# Patient Record
Sex: Male | Born: 1955 | Race: White | Hispanic: No | Marital: Married | Smoking: Never smoker
Health system: Southern US, Community
[De-identification: ages and names within clinical notes are randomized; demographics above are authoritative.]

## PROBLEM LIST (undated history)

## (undated) DIAGNOSIS — T7840XA Allergy, unspecified, initial encounter: Secondary | ICD-10-CM

## (undated) HISTORY — DX: Allergy, unspecified, initial encounter: T78.40XA

## (undated) HISTORY — PX: TONSILLECTOMY AND ADENOIDECTOMY: SHX28

## (undated) HISTORY — PX: MANDIBLE FRACTURE SURGERY: SHX706

## (undated) HISTORY — PX: THROAT SURGERY: SHX803

## (undated) HISTORY — PX: CLAVICLE SURGERY: SHX598

---

## 2008-08-17 ENCOUNTER — Encounter: Admission: RE | Admit: 2008-08-17 | Discharge: 2008-08-17 | Payer: Self-pay | Admitting: Family Medicine

## 2010-09-19 IMAGING — CT CT CHEST W/ CM
2 of 4 series · 15 of 36 positions shown, 18 images · IV contrast (CONTRAST)
Comparison: None

CLINICAL DATA: Bone pain, evaluate pulmonary nodules seen on chest
radiograph

CT CHEST WITH CONTRAST
TECHNIQUE: Multidetector CT imaging of the chest was performed
following the standard protocol during bolus administration of
intravenous contrast.
Contrast: 75 ml of omni 300

[Series 2: w/ · axial · 0.68mm/px · z∈[+1393,+1688]mm · 12 of 69 slices shown, 15 images]
[im 5/69  mediastinal]
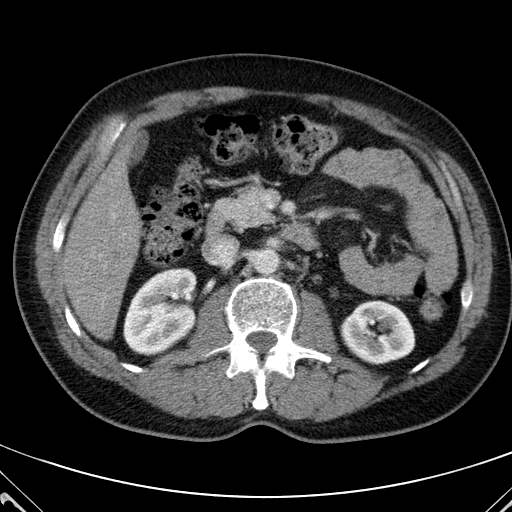
[im 5/69  lung]
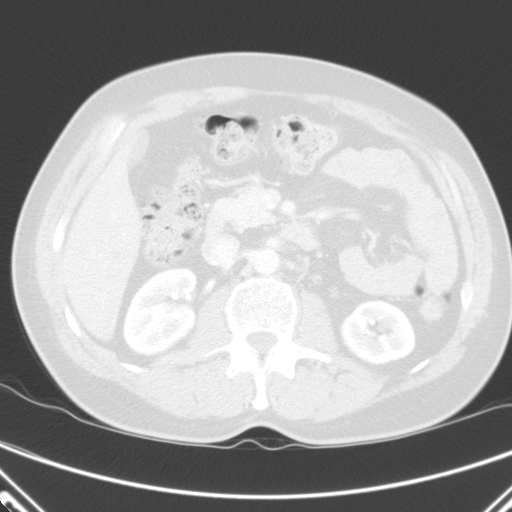
[im 10/69  lung]
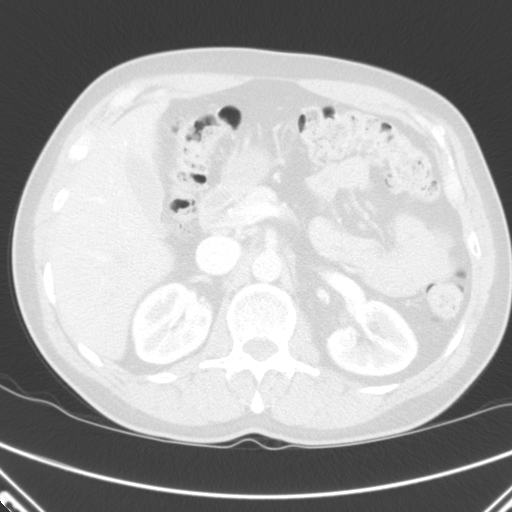
[im 15/69  lung]
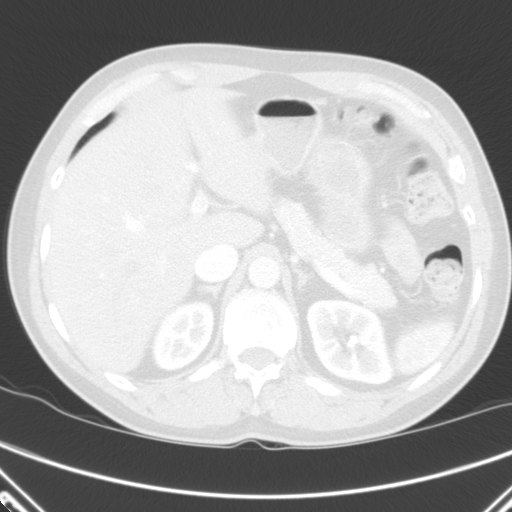
[im 20/69  lung]
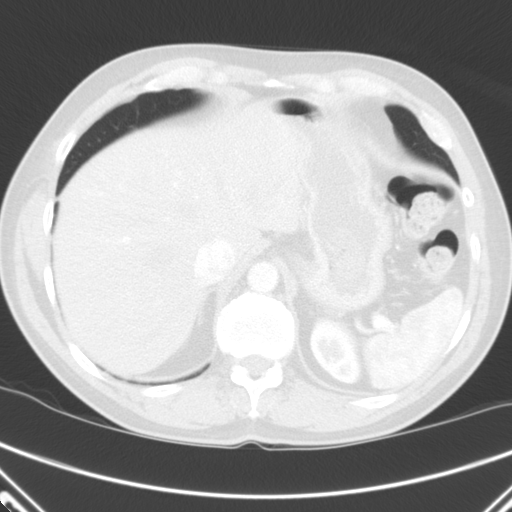
[im 25/69  mediastinal]
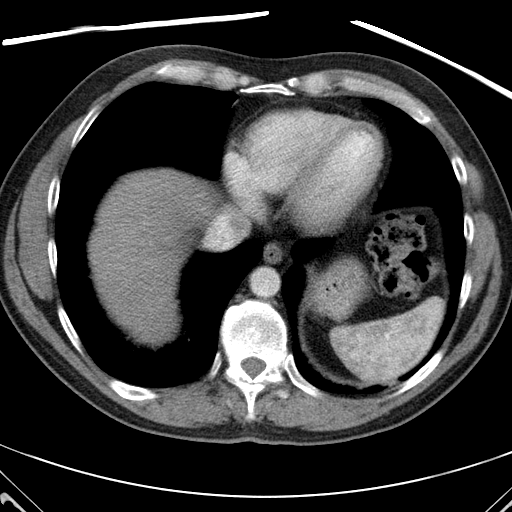
[im 25/69  lung]
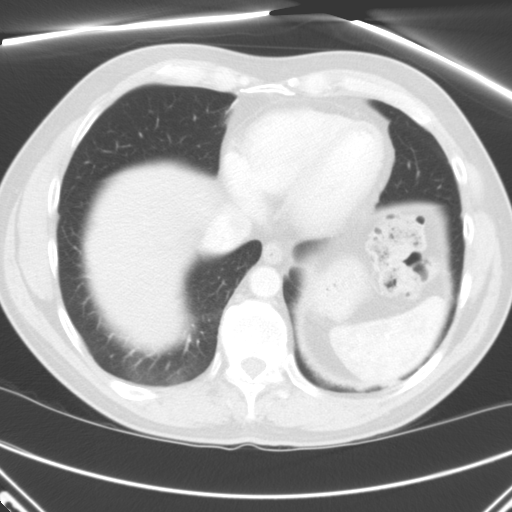
[im 30/69  lung]
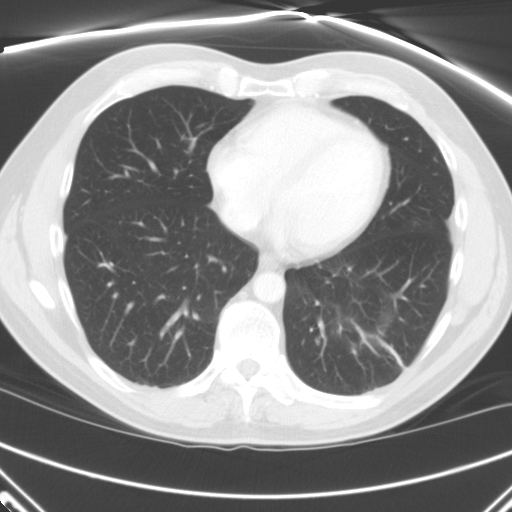
[im 39/69  lung]
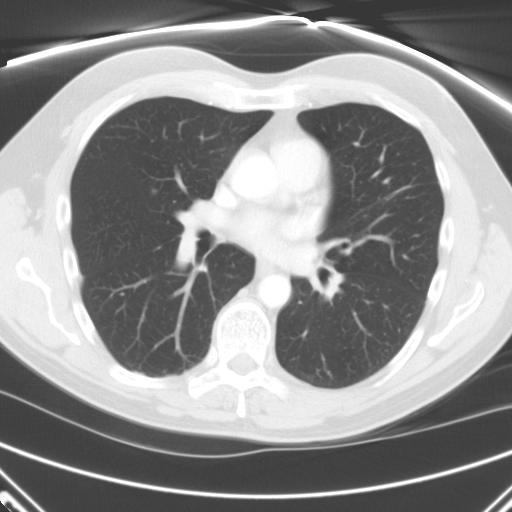
[im 44/69  lung]
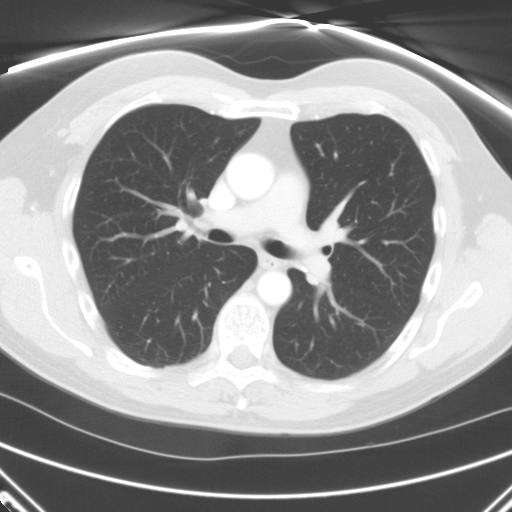
[im 49/69  mediastinal]
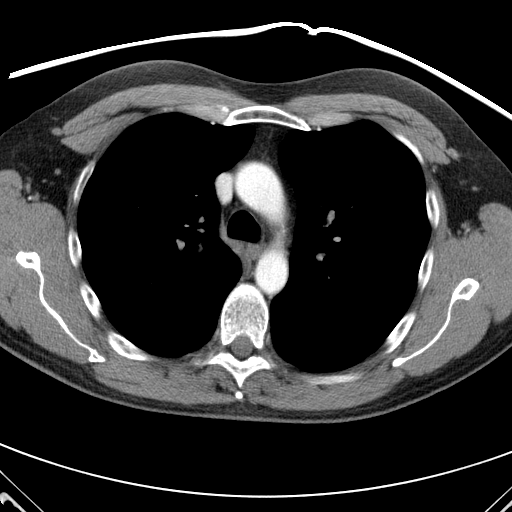
[im 49/69  lung]
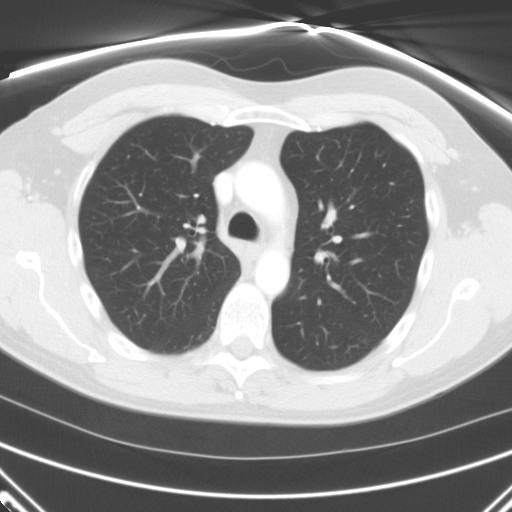
[im 54/69  lung]
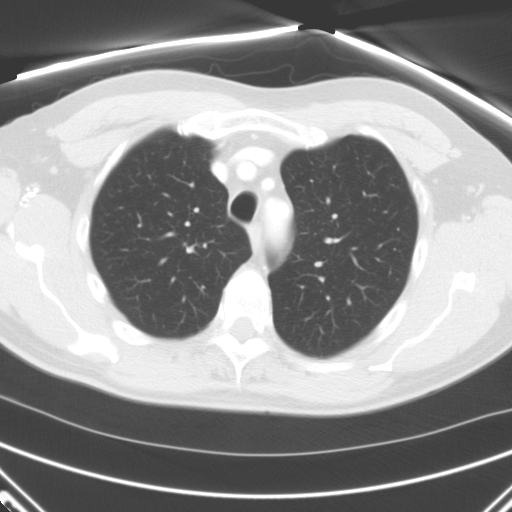
[im 59/69  lung]
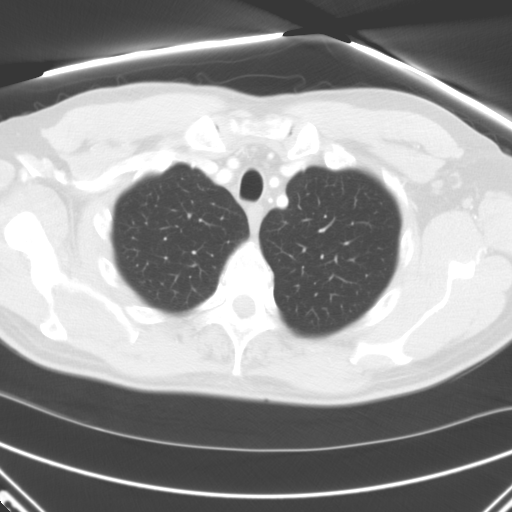
[im 64/69  lung]
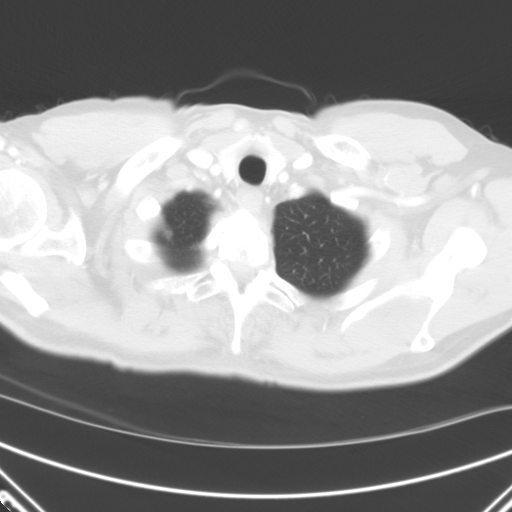

[coronals · coronal · 0.68mm/px · 3 of 103 slices shown]
[im 21/103  lung]
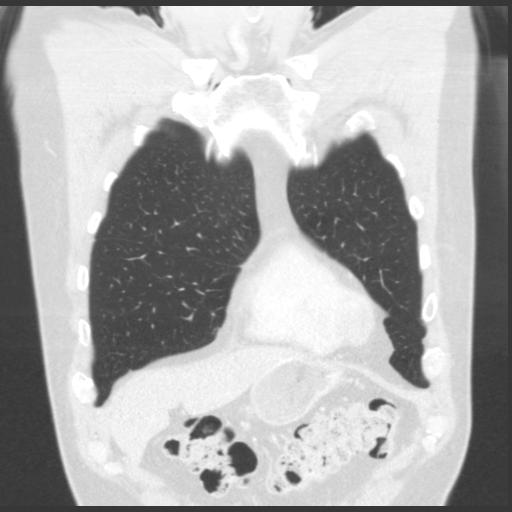
[im 41/103  lung]
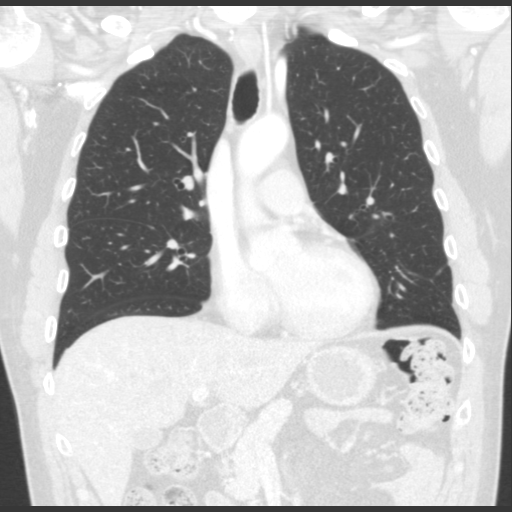
[im 62/103  lung]
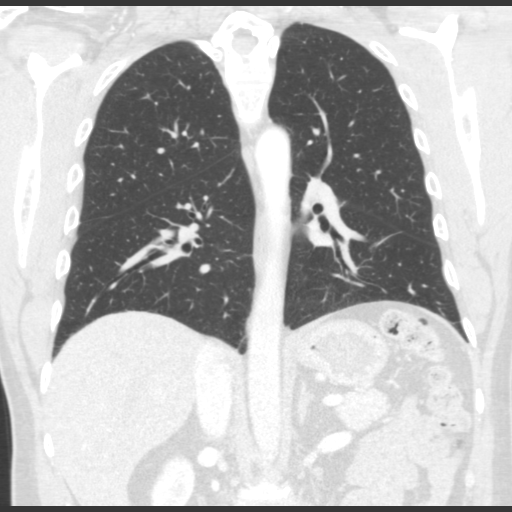

[15 of 36 positions shown; findings below may reference images not displayed]

FINDINGS: There is no enlarged axillary or supraclavicular lymph nodes.

There is no mediastinal or hilar adenopathy.

There is no pericardial or pleural effusion.

Within the left lower lobe there is a calcified granuloma which
measures 8 mm, image 33.

There is a noncalcified subpleural nodule within the right middle
lobe which measures 4.4 mm, image 32.

No additional pulmonary nodules or masses identified.

Review of the visualized osseous structures shows no suspicious
lytic or sclerotic bone lesions.

Limited imaging through the upper abdomen shows no lytic or
sclerotic lesions.
IMPRESSION: 1.  Pulmonary nodule in the left base is densely calcified
consistent with granuloma.
2.  Noncalcified subpleural nodule the right middle lobe may
represent a subpleural lymph node. If the patient is a smoker or
has a risk factors for bronchogenic carcinoma, follow-up chest CT
at 1 year is recommended.  This recommendation follows the
consensus statement:  "Guidelines for Management of Small Pulmonary
Nodules Detected on CT Scans:  A Statement from the [facility]

## 2011-05-06 ENCOUNTER — Ambulatory Visit: Payer: Managed Care, Other (non HMO)

## 2011-05-06 DIAGNOSIS — L02419 Cutaneous abscess of limb, unspecified: Secondary | ICD-10-CM

## 2011-05-06 DIAGNOSIS — L258 Unspecified contact dermatitis due to other agents: Secondary | ICD-10-CM

## 2011-07-02 ENCOUNTER — Ambulatory Visit: Payer: Managed Care, Other (non HMO) | Admitting: Family Medicine

## 2011-07-02 VITALS — BP 110/78 | HR 68 | Temp 97.8°F | Resp 16 | Ht 71.0 in | Wt 193.0 lb

## 2011-07-02 DIAGNOSIS — J069 Acute upper respiratory infection, unspecified: Secondary | ICD-10-CM

## 2011-07-02 DIAGNOSIS — J019 Acute sinusitis, unspecified: Secondary | ICD-10-CM

## 2011-07-02 MED ORDER — AMOXICILLIN 500 MG PO CAPS: ORAL_CAPSULE | ORAL | Status: DC

## 2011-07-02 NOTE — Progress Notes (Signed)
  Patient Name: Colton Suarez Date of Birth: 1956/05/16 Medical Record Number: [id number] Gender: male Date of Encounter: 07/02/2011  History of Present Illness:  Colton Suarez is a 56 y.o. very pleasant male patient who presents with the following:  Ilness for about 10 days, sinus drainage and scratchy throat.  No cough- symptoms are more in his head. Sinus pressure over right eye. Some ear pressure for 2 days.  No fever, chills or myalgias.  "I'm supposed to take a zyrtec" but sometimes seems to make him drowsy so he does not always take this.  Otherwise generally healthy and well.  I have reviewed the patient's medical history in detail and updated the computerized patient record.  There is no problem list on file for this patient.  Past Medical History  Diagnosis Date  . Allergy   . Hyperlipidemia    Past Surgical History  Procedure Date  . Tonsillectomy and adenoidectomy     at 56yo   History  Substance Use Topics  . Smoking status: Never Smoker   . Smokeless tobacco: Never Used  . Alcohol Use: Yes     social   Family History  Problem Relation Age of Onset  . Hypertension Father    Allergies  Allergen Reactions  . Other     STEROIDS    Medication list has been reviewed and updated.  Review of Systems: As per HPI.  Otherwise he reports no GI symptoms, does have some sneezing and runny nose.  Nasal discharge is becoming thick and discolored.    Physical Examination: Filed Vitals:   07/02/11 0817  BP: 110/78  Pulse: 68  Temp: 97.8 F (36.6 C)  Resp: 16  Height: 5\' 11"  (1.803 m)  Weight: 193 lb (87.544 kg)    Body mass index is 26.92 kg/(m^2).  GEN: WDWN, NAD, Non-toxic, A & O x 3 HEENT: Atraumatic, Normocephalic. Neck supple. No masses, No LAD.  oropharynx wnl but visible drainage down throat.  Ears and Nose: No external deformity.  TM and IAC wnl bilaterally, nasal cavity is inflamed and swollen.   CV: RRR, No M/G/R. No JVD. No thrill. No extra heart  sounds. PULM: CTA B, no wheezes, crackles, rhonchi. No retractions. No resp. distress. No accessory muscle use. ABD: S, NT, ND, +BS. No rebound. No HSM. EXTR: No c/c/e NEURO Normal gait.  PSYCH: Normally interactive. Conversant. Not depressed or anxious appearing.  Calm demeanor.    Assessment and Plan: 1. Sinusitis acute  amoxicillin (AMOXIL) 500 MG capsule   As above. Patient (or parent if minor) instructed to return to clinic or call if not better in 3-4 day(s).  Encouraged plenty of fluids, mucinex as needed.

## 2011-07-02 NOTE — Progress Notes (Deleted)
  Subjective:    Patient ID: Colton Suarez, male    DOB: 04-27-1956, 56 y.o.   MRN: [id number]  HPI    Review of Systems     Objective:   Physical Exam        Assessment & Plan:

## 2011-07-08 ENCOUNTER — Ambulatory Visit: Payer: Managed Care, Other (non HMO) | Admitting: Family Medicine

## 2011-07-08 VITALS — BP 110/73 | HR 67 | Temp 97.8°F | Resp 16 | Ht 69.5 in | Wt 190.2 lb

## 2011-07-08 DIAGNOSIS — J019 Acute sinusitis, unspecified: Secondary | ICD-10-CM

## 2011-07-08 MED ORDER — AZITHROMYCIN 250 MG PO TABS: ORAL_TABLET | ORAL | Status: AC

## 2011-07-08 MED ORDER — PREDNISONE 20 MG PO TABS: ORAL_TABLET | ORAL | Status: AC

## 2011-07-08 NOTE — Progress Notes (Signed)
  Subjective:    Patient ID: Colton Suarez, male    DOB: 01-07-1956, 56 y.o.   MRN: [id number]  HPI 56 yo male seen 07/02/11 and diagnosed with sinusitis.  Started on Amoxicillin.  Since that time, worsened.  Increased sinus pressure, ear pressure, thickened nasal discharge, brown.  Cough also productive brown phlegm.  Occasional subjective fever.  Sinus pressure/HA worse outside.  Teeth hurt too.  Cough not bad at night.  "Amoxicillin never works.  Need zpak"  Review of Systems Negative except as per HPI     Objective:   Physical Exam  Constitutional: He appears well-developed. No distress.  HENT:  Right Ear: External ear and ear canal normal. Tympanic membrane is not injected, not scarred, not perforated, not erythematous, not retracted and not bulging. A middle ear effusion is present.  Left Ear: Tympanic membrane, external ear and ear canal normal. Tympanic membrane is not injected, not scarred, not perforated, not erythematous, not retracted and not bulging.  Nose: Mucosal edema present. No rhinorrhea. Right sinus exhibits maxillary sinus tenderness and frontal sinus tenderness. Left sinus exhibits no maxillary sinus tenderness and no frontal sinus tenderness.  Mouth/Throat: Uvula is midline, oropharynx is clear and moist and mucous membranes are normal. No oropharyngeal exudate or tonsillar abscesses.  Cardiovascular: Normal rate, regular rhythm, normal heart sounds and intact distal pulses.   No murmur heard. Pulmonary/Chest: Effort normal and breath sounds normal. No respiratory distress. He has no wheezes. He has no rales.  Lymphadenopathy:       Head (right side): No submandibular and no preauricular adenopathy present.       Head (left side): No submandibular and no preauricular adenopathy present.       Right cervical: No superficial cervical and no posterior cervical adenopathy present.      Left cervical: No superficial cervical and no posterior cervical adenopathy present.      Right: No supraclavicular adenopathy present.       Left: No supraclavicular adenopathy present.  Skin: Skin is warm and dry.          Assessment & Plan:  Sinusitis, resistant zpak pred taper

## 2011-11-30 ENCOUNTER — Ambulatory Visit: Payer: Managed Care, Other (non HMO) | Admitting: Physician Assistant

## 2011-11-30 VITALS — BP 124/76 | HR 84 | Temp 99.0°F | Resp 17 | Ht 70.0 in | Wt 187.0 lb

## 2011-11-30 DIAGNOSIS — R059 Cough, unspecified: Secondary | ICD-10-CM

## 2011-11-30 DIAGNOSIS — R05 Cough: Secondary | ICD-10-CM

## 2011-11-30 DIAGNOSIS — J329 Chronic sinusitis, unspecified: Secondary | ICD-10-CM

## 2011-11-30 MED ORDER — IPRATROPIUM BROMIDE 0.03 % NA SOLN: 2.0000 | Freq: Two times a day (BID) | NASAL | Status: DC

## 2011-11-30 MED ORDER — AZITHROMYCIN 250 MG PO TABS: ORAL_TABLET | ORAL | Status: AC

## 2011-11-30 MED ORDER — HYDROCODONE-HOMATROPINE 5-1.5 MG/5ML PO SYRP: 5.0000 mL | ORAL_SOLUTION | Freq: Three times a day (TID) | ORAL | Status: AC | PRN

## 2011-11-30 NOTE — Progress Notes (Signed)
  Subjective:    Patient ID: Colton Suarez, male    DOB: [birth year], 56 y.o.   MRN: [id number]  Sinus Problem  Ear Fullness    Patient presents with 3 day history of nasal congestion, postnasal drainage, sinus pain/pressure, and ear fullness. Also complains of sore throat that has improved, as well as some fever/chills. Denies headache, nausea, vomiting, or otalgia.  No know sick contacts. No history of asthma or seasonal allergies.    Review of Systems  All other systems reviewed and are negative.       Objective:   Physical Exam  Constitutional: He is oriented to person, place, and time. He appears well-developed and well-nourished.  HENT:  Head: Normocephalic and atraumatic.  Right Ear: Hearing, external ear and ear canal normal. A middle ear effusion is present.  Left Ear: Hearing, tympanic membrane, external ear and ear canal normal.  Nose: Right sinus exhibits maxillary sinus tenderness. Right sinus exhibits no frontal sinus tenderness. Left sinus exhibits maxillary sinus tenderness. Left sinus exhibits no frontal sinus tenderness.  Mouth/Throat: Uvula is midline, oropharynx is clear and moist and mucous membranes are normal. No oropharyngeal exudate.  Eyes: Conjunctivae are normal.  Neck: Normal range of motion. Neck supple.  Cardiovascular: Normal rate, regular rhythm and normal heart sounds.   Pulmonary/Chest: Effort normal and breath sounds normal.  Lymphadenopathy:    He has no cervical adenopathy.  Neurological: He is alert and oriented to person, place, and time.  Psychiatric: He has a normal mood and affect. His behavior is normal. Judgment and thought content normal.          Assessment & Plan:   1. Sinusitis  Will treat with a Z-pack. Use Atrovent NS as directed If symptoms not improving in 3-4 days, ok to call in prednisone taper (per patient ok to take this).  azithromycin (ZITHROMAX) 250 MG tablet, ipratropium (ATROVENT) 0.03 % nasal spray  2. Cough    Take Hycodan as needed for cough.  HYDROcodone-homatropine (HYCODAN) 5-1.5 MG/5ML syrup

## 2012-01-15 ENCOUNTER — Ambulatory Visit (INDEPENDENT_AMBULATORY_CARE_PROVIDER_SITE_OTHER): Payer: Managed Care, Other (non HMO) | Admitting: Internal Medicine

## 2012-01-15 VITALS — BP 128/78 | HR 61 | Temp 97.8°F | Resp 16 | Ht 69.0 in | Wt 191.0 lb

## 2012-01-15 DIAGNOSIS — L02419 Cutaneous abscess of limb, unspecified: Secondary | ICD-10-CM

## 2012-01-15 DIAGNOSIS — L03116 Cellulitis of left lower limb: Secondary | ICD-10-CM

## 2012-01-15 DIAGNOSIS — B369 Superficial mycosis, unspecified: Secondary | ICD-10-CM

## 2012-01-15 MED ORDER — DOXYCYCLINE HYCLATE 100 MG PO TABS: 100.0000 mg | ORAL_TABLET | Freq: Two times a day (BID) | ORAL | Status: AC

## 2012-01-15 MED ORDER — TRIAMCINOLONE ACETONIDE 0.1 % EX CREA: TOPICAL_CREAM | Freq: Two times a day (BID) | CUTANEOUS | Status: DC

## 2012-01-15 NOTE — Progress Notes (Signed)
  Subjective:    Patient ID: Colton Suarez, male    DOB: 07-06-1955, 56 y.o.   MRN: [id number]  HPI    Review of Systems     Objective:   Physical Exam        Assessment & Plan:

## 2012-01-15 NOTE — Patient Instructions (Addendum)

## 2012-01-15 NOTE — Progress Notes (Signed)
  Subjective:    Patient ID: Colton Suarez, male    DOB: 07-29-1955, 56 y.o.   MRN: [id number]  HPI  Patient presents with one month history of wound to lower leg. He had injury tree branch hit the leg and has had redness since. Feels like the area is infected.  Tender has no healed for one month/ Patient also with complaints of nasal/ sinus drainage nad congetsin Review of Systems  HENT: Positive for congestion and rhinorrhea.   Skin: Positive for rash and wound.  All other systems reviewed and are negative.       Objective:   Physical Exam  Nursing note and vitals reviewed. Constitutional: He is oriented to person, place, and time. He appears well-developed and well-nourished.  Eyes: Conjunctivae are normal. Pupils are equal, round, and reactive to light.  Neck: Normal range of motion. Neck supple.  Cardiovascular: Normal rate and normal heart sounds.   Pulmonary/Chest: Effort normal.  Abdominal: Soft. Bowel sounds are normal.  Musculoskeletal: He exhibits tenderness.       Erythema of lower extemity wound area with lacy annular lesions surrounding the area.   Neurological: He is alert and oriented to person, place, and time.  Skin:       As noted on exam  Psychiatric: He has a normal mood and affect. His behavior is normal. Judgment and thought content normal.    Patient with redness and has ? Fungal infection as well. No purulence could be expressed from this area.       Assessment & Plan:  Cellulitis with superficail seconday fungal infectionShould elevate leg and apply kenalog cream for fungal infection and take doxycycline until gone.Marland Kitchenkeep open to the air.

## 2012-03-15 ENCOUNTER — Ambulatory Visit: Payer: Managed Care, Other (non HMO)

## 2012-03-15 ENCOUNTER — Ambulatory Visit (INDEPENDENT_AMBULATORY_CARE_PROVIDER_SITE_OTHER): Payer: Managed Care, Other (non HMO) | Admitting: Emergency Medicine

## 2012-03-15 VITALS — BP 104/60 | HR 60 | Temp 98.1°F | Resp 16 | Ht 71.0 in | Wt 186.4 lb

## 2012-03-15 DIAGNOSIS — N529 Male erectile dysfunction, unspecified: Secondary | ICD-10-CM

## 2012-03-15 DIAGNOSIS — Z0000 Encounter for general adult medical examination without abnormal findings: Secondary | ICD-10-CM

## 2012-03-15 DIAGNOSIS — Z23 Encounter for immunization: Secondary | ICD-10-CM

## 2012-03-15 DIAGNOSIS — Z0289 Encounter for other administrative examinations: Secondary | ICD-10-CM

## 2012-03-15 LAB — COMPREHENSIVE METABOLIC PANEL
ALT: 17 U/L (ref 0–53)
AST: 18 U/L (ref 0–37)
Alkaline Phosphatase: 62 U/L (ref 39–117)
Sodium: 139 mEq/L (ref 135–145)
Total Bilirubin: 0.8 mg/dL (ref 0.3–1.2)
Total Protein: 7 g/dL (ref 6.0–8.3)

## 2012-03-15 LAB — POCT CBC
Lymph, poc: 1.6 (ref 0.6–3.4)
MCHC: 31.4 g/dL — AB (ref 31.8–35.4)
MPV: 8.4 fL (ref 0–99.8)
POC Granulocyte: 2.4 (ref 2–6.9)
POC LYMPH PERCENT: 37 %L (ref 10–50)
POC MID %: 8.1 %M (ref 0–12)
Platelet Count, POC: 254 10*3/uL (ref 142–424)
RDW, POC: 13.8 %

## 2012-03-15 LAB — LIPID PANEL
LDL Cholesterol: 118 mg/dL — ABNORMAL HIGH (ref 0–99)
Triglycerides: 47 mg/dL (ref <150)
VLDL: 9 mg/dL (ref 0–40)

## 2012-03-15 LAB — TESTOSTERONE: Testosterone: 518.94 ng/dL (ref 300–890)

## 2012-03-15 NOTE — Progress Notes (Signed)
[facility] [address] [place] [address] [phone]  Date:  03/15/2012   Name:  Colton Suarez   DOB:  1955-07-25   MRN:  [id number]  PCP:  No primary provider on file.    Chief Complaint: Annual Exam and Rash   History of Present Illness:  Colton Suarez is a 56 y.o. very pleasant male patient who presents with the following:  Seen twice he says for a "fungal infection" on hie left shin after brushing it against some weeds.  Has recurred since visit in August.  Symptomatic for two weeks.  There is no problem list on file for this patient.   Past Medical History  Diagnosis Date  . Allergy   . Hyperlipidemia     Past Surgical History  Procedure Date  . Tonsillectomy and adenoidectomy     at 56yo  . Throat surgery     tosilectomy    History  Substance Use Topics  . Smoking status: Never Smoker   . Smokeless tobacco: Never Used  . Alcohol Use: Yes     social    Family History  Problem Relation Age of Onset  . Hypertension Father     Allergies  Allergen Reactions  . Other     STEROIDS    Medication list has been reviewed and updated.  Current Outpatient Prescriptions on File Prior to Visit  Medication Sig Dispense Refill  . cetirizine (ZYRTEC) 10 MG tablet Take 10 mg by mouth daily.      Marland Kitchen ipratropium (ATROVENT) 0.03 % nasal spray Place 2 sprays into the nose 2 (two) times daily.  30 mL  5  . triamcinolone cream (KENALOG) 0.1 % Apply topically 2 (two) times daily.  15 g  1    Review of Systems:  As per HPI, otherwise negative.    Physical Examination: Filed Vitals:   03/15/12 1031  BP: 104/60  Pulse: 60  Temp: 98.1 F (36.7 C)  Resp: 16   Filed Vitals:   03/15/12 1031  Height: 5\' 11"  (1.803 m)  Weight: 186 lb 6.4 oz (84.55 kg)   Body mass index is 26.00 kg/(m^2). Ideal Body Weight: Weight in (lb) to have BMI = 25: 178.9   GEN: WDWN, NAD, Non-toxic, A & O x 3 HEENT: Atraumatic, Normocephalic. Neck supple.  No masses, No LAD. Ears and Nose: No external deformity. CV: RRR, No M/G/R. No JVD. No thrill. No extra heart sounds. PULM: CTA B, no wheezes, crackles, rhonchi. No retractions. No resp. distress. No accessory muscle use. ABD: S, NT, ND, +BS. No rebound. No HSM. EXTR: No c/c/e NEURO Normal gait.  PSYCH: Normally interactive. Conversant. Not depressed or anxious appearing.  Calm demeanor.  SKIN:  Left shin lesions; inferior lesion discolored, does not blanch, appears ecchymotic.  Scaly.  Superior lesion is area about 1.5 x  1.5 cm of superficial skin avulsion.  No cellulitis.  Assessment and Plan: wellness exam labs  Carmelina Dane, MD

## 2012-03-20 ENCOUNTER — Telehealth: Payer: Self-pay | Admitting: Radiology

## 2012-03-20 ENCOUNTER — Encounter: Payer: Self-pay | Admitting: *Deleted

## 2012-03-20 NOTE — Telephone Encounter (Signed)
Patient has called and was advised of his lab results. He is asking about his dermatology appt. He is advise it looks like it has been sent. Will you call him to advise where it was sent? Amy

## 2012-03-24 NOTE — Progress Notes (Signed)
Reviewed and agree.

## 2012-08-14 ENCOUNTER — Ambulatory Visit: Payer: Self-pay | Admitting: Internal Medicine

## 2012-08-14 VITALS — BP 92/60 | HR 63 | Temp 98.1°F | Resp 16 | Ht 69.5 in | Wt 189.0 lb

## 2012-08-14 DIAGNOSIS — Z0289 Encounter for other administrative examinations: Secondary | ICD-10-CM

## 2012-08-14 NOTE — Progress Notes (Signed)
  Subjective:    Patient ID: Colton Suarez, male    DOB: 06/15/1955, 57 y.o.   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

## 2012-10-04 ENCOUNTER — Encounter: Payer: Self-pay | Admitting: Internal Medicine

## 2012-10-19 ENCOUNTER — Ambulatory Visit: Payer: Managed Care, Other (non HMO) | Admitting: Family Medicine

## 2012-10-19 VITALS — BP 100/68 | HR 78 | Temp 98.0°F | Resp 16 | Ht 71.0 in | Wt 192.6 lb

## 2012-10-19 DIAGNOSIS — B353 Tinea pedis: Secondary | ICD-10-CM

## 2012-10-19 DIAGNOSIS — S90821A Blister (nonthermal), right foot, initial encounter: Secondary | ICD-10-CM

## 2012-10-19 DIAGNOSIS — IMO0002 Reserved for concepts with insufficient information to code with codable children: Secondary | ICD-10-CM

## 2012-10-19 LAB — POCT SKIN KOH: Skin KOH, POC: POSITIVE

## 2012-10-19 NOTE — Progress Notes (Signed)
[facility]:  Office Visit  Chief Complaint:  Chief Complaint  Patient presents with  . Foot Problem    x 1 week  right foot     HPI: Colton Suarez is a 57 y.o. male who complains of  1 week history of foot rash. He denies any fevers, chills. He has not been to the beach,has not been soaking his feet, has not been doing anything different. He noticed it first on the middle of his right foot with vesicles and now he has bumps in between his web spaces, they are filled with fluid. No pain, no itching thus far. He denies having it before. He has not tried anything for it. He does wear socks and shoes most of the day. Denies any autoimmune disease.   Past Medical History  Diagnosis Date  . Allergy   . Hyperlipidemia    Past Surgical History  Procedure Laterality Date  . Tonsillectomy and adenoidectomy      at 57yo  . Throat surgery      tosilectomy   History   Social History  . Marital Status: Single    Spouse Name: N/A    Number of Children: N/A  . Years of Education: N/A   Social History Main Topics  . Smoking status: Never Smoker   . Smokeless tobacco: Never Used  . Alcohol Use: Yes     Comment: social  . Drug Use: No  . Sexually Active: None   Other Topics Concern  . None   Social History Narrative  . None   Family History  Problem Relation Age of Onset  . Hypertension Father    Allergies  Allergen Reactions  . Other     STEROIDS   Prior to Admission medications   Medication Sig Start Date End Date Taking? Authorizing Provider  cetirizine (ZYRTEC) 10 MG tablet Take 10 mg by mouth daily.   Yes Historical Provider, MD     ROS: The patient denies fevers, chills, night sweats, unintentional weight loss, chest pain, palpitations, wheezing, dyspnea on exertion, nausea, vomiting, abdominal pain, dysuria, hematuria, melena, numbness, weakness, or tingling.   All other systems have been reviewed and were otherwise negative with the exception  of those mentioned in the HPI and as above.    PHYSICAL EXAM: Filed Vitals:   10/19/12 1454  BP: 100/68  Pulse: 78  Temp: 98 F (36.7 C)  Resp: 16   Filed Vitals:   10/19/12 1454  Height: 5\' 11"  (1.803 m)  Weight: 192 lb 9.6 oz (87.363 kg)   Body mass index is 26.87 kg/(m^2).  General: Alert, no acute distress HEENT:  Normocephalic, atraumatic, oropharynx patent.  Cardiovascular:  Regular rate and rhythm, no rubs murmurs or gallops.  No pedal edema.  Respiratory: Clear to auscultation bilaterally.  No wheezes, rales, or rhonchi.  No cyanosis, no use of accessory musculature GI: No organomegaly, abdomen is soft and non-tender, positive bowel sounds.  No masses. Skin: see below Neurologic: Facial musculature symmetric. Psychiatric: Patient is appropriate throughout our interaction. Lymphatic: No cervical lymphadenopathy Musculoskeletal: Gait intact. + small areas of right foot affected, maceration and cracks/fissures  in between web spaces of toes,  vesicles on middle toe, serosanguinous blisters. No pus. Light ot dark borwn spots appears to be vesicles that have popped and are now healing    LABS: Results for orders placed in visit on 10/19/12  POCT SKIN KOH      Result Value Range  Skin KOH, POC Positive       EKG/XRAY:   Primary read interpreted by Dr. Conley Rolls at [facility].   ASSESSMENT/PLAN: Encounter Diagnoses  Name Primary?  . Blister of foot, right, initial encounter Yes  . Tinea pedis of right foot    ?  acute vesicular tinea pedis KOH + budding yeast Recommend Burrow's wet dressing BID or daily Recommend otc Lamisil cream or spray BID x 2-4 weeks If no improvement then will try Lamisil 250 mg daily x 3 weeks.  Still if no improvement then will refer to derm F/u prn   Thedore Pickel PHUONG, DO 10/19/2012 3:39 PM

## 2013-02-10 ENCOUNTER — Encounter: Payer: Self-pay | Admitting: Family Medicine

## 2013-02-10 ENCOUNTER — Ambulatory Visit (INDEPENDENT_AMBULATORY_CARE_PROVIDER_SITE_OTHER): Payer: Managed Care, Other (non HMO) | Admitting: Family Medicine

## 2013-02-10 VITALS — BP 100/62 | HR 87 | Temp 98.3°F | Resp 18 | Ht 70.0 in | Wt 185.0 lb

## 2013-02-10 DIAGNOSIS — D72819 Decreased white blood cell count, unspecified: Secondary | ICD-10-CM

## 2013-02-10 DIAGNOSIS — Z0000 Encounter for general adult medical examination without abnormal findings: Secondary | ICD-10-CM

## 2013-02-10 DIAGNOSIS — Z23 Encounter for immunization: Secondary | ICD-10-CM

## 2013-02-10 LAB — LIPID PANEL: LDL Cholesterol: 102 mg/dL — ABNORMAL HIGH (ref 0–99)

## 2013-02-10 LAB — COMPREHENSIVE METABOLIC PANEL
ALT: 19 U/L (ref 0–53)
AST: 19 U/L (ref 0–37)
Albumin: 4.6 g/dL (ref 3.5–5.2)
Alkaline Phosphatase: 63 U/L (ref 39–117)
Glucose, Bld: 86 mg/dL (ref 70–99)
Potassium: 4.3 mEq/L (ref 3.5–5.3)
Sodium: 140 mEq/L (ref 135–145)
Total Protein: 7.2 g/dL (ref 6.0–8.3)

## 2013-02-10 LAB — POCT CBC
Granulocyte percent: 54.6 %G (ref 37–80)
HCT, POC: 47.1 % (ref 43.5–53.7)
POC Granulocyte: 2.2 (ref 2–6.9)
POC LYMPH PERCENT: 36.9 %L (ref 10–50)
Platelet Count, POC: 239 10*3/uL (ref 142–424)
RDW, POC: 14.2 %

## 2013-02-10 LAB — PSA: PSA: 0.72 ng/mL (ref <=4.00)

## 2013-02-10 NOTE — Progress Notes (Signed)
[facility] [address], [place] [address] [phone]  Date:  02/10/2013   Name:  Colton Suarez   DOB:  1955/12/14   MRN:  [id number]  PCP:  No primary provider on file.    Chief Complaint: Annual Exam   History of Present Illness:  Colton Suarez is a 57 y.o. very pleasant male patient who presents with the following:  Here for a CPE and form for his insurance today.   He is fasting today.   PSA in 2012 was 0.76 Colonoscopy is UTD.  Flu shot today  He is generally healthy and does not have any concerns  He is married, a non- smoker.  He does get exercise at work.    There are no active problems to display for this patient.   Past Medical History  Diagnosis Date  . Allergy   . Hyperlipidemia     Past Surgical History  Procedure Laterality Date  . Tonsillectomy and adenoidectomy      at 57yo  . Throat surgery      tosilectomy    History  Substance Use Topics  . Smoking status: Never Smoker   . Smokeless tobacco: Never Used  . Alcohol Use: Yes     Comment: social    Family History  Problem Relation Age of Onset  . Hypertension Father     Allergies  Allergen Reactions  . Other     STEROIDS    Medication list has been reviewed and updated.  Current Outpatient Prescriptions on File Prior to Visit  Medication Sig Dispense Refill  . cetirizine (ZYRTEC) 10 MG tablet Take 10 mg by mouth daily.       No current facility-administered medications on file prior to visit.    Review of Systems:  As per HPI- otherwise negative.   Physical Examination: Filed Vitals:   02/10/13 0845  BP: 100/62  Pulse: 87  Temp: 98.3 F (36.8 C)  Resp: 18   Filed Vitals:   02/10/13 0845  Height: 5\' 10"  (1.778 m)  Weight: 185 lb (83.915 kg)   Body mass index is 26.54 kg/(m^2). Ideal Body Weight: Weight in (lb) to have BMI = 25: 173.9  GEN: WDWN, NAD, Non-toxic, A & O x 3 HEENT: Atraumatic, Normocephalic. Neck supple. No masses, No  LAD.  Bilateral TM wnl, oropharynx normal.  PEERL,EOMI.   Ears and Nose: No external deformity. CV: RRR, No M/G/R. No JVD. No thrill. No extra heart sounds. PULM: CTA B, no wheezes, crackles, rhonchi. No retractions. No resp. distress. No accessory muscle use. ABD: S, NT, ND, +BS. No rebound. No HSM. EXTR: No c/c/e NEURO Normal gait.  PSYCH: Normally interactive. Conversant. Not depressed or anxious appearing.  Calm demeanor.  GU: normal exam, no testicular masses , no lesions or discharge.  Normal prostate, normal rectal exam   Assessment and Plan: Physical exam - Plan: POCT CBC, Comprehensive metabolic panel, Lipid panel, PSA, Flu Vaccine QUAD 36+ mos IM  Healthy male here for a CPE today.  Await labs, immunization UTD  Signed Abbe Amsterdam, MD

## 2013-02-10 NOTE — Patient Instructions (Addendum)
Your blood pressure looks great today. I will be in touch with your labs when they come in.  Please set up your mychart account- this will make it easy for your to receive your labs online.

## 2013-02-10 NOTE — Addendum Note (Signed)
Addended by: Abbe Amsterdam C on: 02/10/2013 08:35 PM   Modules accepted: Orders

## 2014-02-23 ENCOUNTER — Ambulatory Visit (INDEPENDENT_AMBULATORY_CARE_PROVIDER_SITE_OTHER): Payer: Managed Care, Other (non HMO) | Admitting: Family Medicine

## 2014-02-23 VITALS — BP 102/78 | HR 61 | Temp 97.7°F | Resp 16 | Ht 69.5 in | Wt 195.4 lb

## 2014-02-23 DIAGNOSIS — Z1159 Encounter for screening for other viral diseases: Secondary | ICD-10-CM

## 2014-02-23 DIAGNOSIS — Z13220 Encounter for screening for lipoid disorders: Secondary | ICD-10-CM

## 2014-02-23 DIAGNOSIS — Z23 Encounter for immunization: Secondary | ICD-10-CM

## 2014-02-23 DIAGNOSIS — J309 Allergic rhinitis, unspecified: Secondary | ICD-10-CM

## 2014-02-23 DIAGNOSIS — Z1329 Encounter for screening for other suspected endocrine disorder: Secondary | ICD-10-CM

## 2014-02-23 DIAGNOSIS — Z91048 Other nonmedicinal substance allergy status: Secondary | ICD-10-CM

## 2014-02-23 DIAGNOSIS — Z136 Encounter for screening for cardiovascular disorders: Secondary | ICD-10-CM

## 2014-02-23 DIAGNOSIS — Z0000 Encounter for general adult medical examination without abnormal findings: Secondary | ICD-10-CM

## 2014-02-23 DIAGNOSIS — Z9109 Other allergy status, other than to drugs and biological substances: Secondary | ICD-10-CM

## 2014-02-23 LAB — TSH: TSH: 2.468 u[IU]/mL (ref 0.350–4.500)

## 2014-02-23 LAB — COMPLETE METABOLIC PANEL WITH GFR
ALBUMIN: 4.7 g/dL (ref 3.5–5.2)
ALK PHOS: 66 U/L (ref 39–117)
ALT: 29 U/L (ref 0–53)
AST: 21 U/L (ref 0–37)
BUN: 9 mg/dL (ref 6–23)
CHLORIDE: 102 meq/L (ref 96–112)
CO2: 27 mEq/L (ref 19–32)
Calcium: 9.6 mg/dL (ref 8.4–10.5)
Creat: 0.96 mg/dL (ref 0.50–1.35)
GFR, Est African American: >89 mL/min
GFR, Est Non African American: 87 mL/min
Glucose, Bld: 90 mg/dL (ref 70–99)
POTASSIUM: 4.9 meq/L (ref 3.5–5.3)
SODIUM: 137 meq/L (ref 135–145)
TOTAL PROTEIN: 7.4 g/dL (ref 6.0–8.3)
Total Bilirubin: 0.9 mg/dL (ref 0.2–1.2)

## 2014-02-23 LAB — LIPID PANEL
CHOL/HDL RATIO: 3.8 ratio
Cholesterol: 207 mg/dL — ABNORMAL HIGH (ref 0–200)
HDL: 54 mg/dL (ref >39)
LDL CALC: 132 mg/dL — AB (ref 0–99)
Triglycerides: 107 mg/dL (ref <150)
VLDL: 21 mg/dL (ref 0–40)

## 2014-02-23 LAB — POCT CBC
Granulocyte percent: 58.9 %G (ref 37–80)
HCT, POC: 47.2 % (ref 43.5–53.7)
Hemoglobin: 15.3 g/dL (ref 14.1–18.1)
Lymph, poc: 2 (ref 0.6–3.4)
MCH: 30.1 pg (ref 27–31.2)
MCHC: 32.5 g/dL (ref 31.8–35.4)
MCV: 92.7 fL (ref 80–97)
MID (CBC): 0.4 (ref 0–0.9)
MPV: 7.2 fL (ref 0–99.8)
PLATELET COUNT, POC: 232 10*3/uL (ref 142–424)
POC GRANULOCYTE: 3.5 (ref 2–6.9)
POC LYMPH PERCENT: 34.5 %L (ref 10–50)
POC MID %: 6.6 % (ref 0–12)
RBC: 5.09 M/uL (ref 4.69–6.13)
RDW, POC: 13.6 %
WBC: 5.9 10*3/uL (ref 4.6–10.2)

## 2014-02-23 MED ORDER — FLUTICASONE PROPIONATE 50 MCG/ACT NA SUSP: 2.0000 | Freq: Every day | NASAL | Status: AC

## 2014-02-23 NOTE — Progress Notes (Signed)
IDENTIFYING INFORMATION  Colton Suarez / male / 08-16-1955 / 58 y.o. / MRN: [id number]  SUBJECTIVE  Chief Complaint: had a chief complaint of Annual Exam.  History of present illness: Colton Suarez is a self reportedly healthy 58 year old caucasian male here for an annual physical. He has an insurance form with him today that he would like to fill out.   He has no complaints today, however he relates a history of allergies and would like a prescription for fluticasone intranasal.     The patient denies a family history of prostate cancer as well as urinary hesitancy and post void dribble.  He received a colonoscopy at age 34 and was told to receive he next colon screening at age 29.    He has not had the flu vaccine and would like one today.  He has never been screened for hepatitis C.  The problem list, allergies, medications, family, surgical, and social history were reviewed by me and exist elsewhere in the encounter.    Review of Systems  Constitutional: Negative.   HENT: Negative.   Eyes: Negative.   Respiratory: Negative.   Cardiovascular: Negative.   Gastrointestinal: Negative.   Genitourinary: Negative.   Musculoskeletal: Negative.   Skin: Negative.   Neurological: Negative.   Endo/Heme/Allergies: Negative.   Psychiatric/Behavioral: Negative.     OBJECTIVE  Blood pressure 102/78, pulse 61, temperature 97.7 F (36.5 C), temperature source Oral, resp. rate 16, height 5' 9.5" (1.765 m), weight 195 lb 6.4 oz (88.633 kg), SpO2 100.00%.  Physical Exam  Constitutional: He is oriented to person, place, and time and well-developed, well-nourished, and in no distress. No distress.  HENT:  Head: Normocephalic and atraumatic.  Nose: Nose normal.  Mouth/Throat: Oropharynx is clear and moist.  Eyes: EOM are normal. Pupils are equal, round, and reactive to light. Right eye exhibits no discharge. Left eye exhibits no discharge. No scleral icterus.  Neck: Normal range of  motion. Neck supple. No JVD present. No tracheal deviation present. No thyromegaly present.  Cardiovascular: Normal rate, regular rhythm, normal heart sounds and intact distal pulses.  Exam reveals no gallop and no friction rub.   No murmur heard. Pulmonary/Chest: Effort normal and breath sounds normal. No stridor. No respiratory distress. He has no wheezes. He has no rales.  Abdominal: Soft. Bowel sounds are normal. He exhibits no distension and no mass. There is no tenderness. There is no rebound and no guarding.  Musculoskeletal: Normal range of motion. He exhibits no edema and no tenderness.  Lymphadenopathy:    He has no cervical adenopathy.  Neurological: He is alert and oriented to person, place, and time. He has normal reflexes. He displays normal reflexes. No cranial nerve deficit. He exhibits normal muscle tone. Gait normal. Coordination normal.  Skin: Skin is warm and dry. He is not diaphoretic.  Psychiatric: Mood, memory, affect and judgment normal.   EKG: Rate 58, NSR, QRS Axis at -1, -hypertrophy, -ischemia/infartion.  RBB pattern noted on V1. Non specific t-wave inversion in III.    ASSESSMENT & PLAN  Physical exam, annual - Plan: COMPLETE METABOLIC PANEL WITH GFR, POCT CBC, CANCELED: Glucose (CBG), Fasting  Need for prophylactic vaccination and inoculation against influenza - Plan: Flu Vaccine QUAD 36+ mos IM  History of environmental allergies - Plan: fluticasone (FLONASE) 50 MCG/ACT nasal spray  Need for lipid screening - Plan: Lipid panel  Thyroid disorder screening - Plan: TSH  Screening for heart disease - Plan: EKG  12-Lead  The patient was instructed to to call or comeback to clinic as needed, or should symptoms warrant.  Deliah BostonMichael Clark, MS, PA-C [facility] 02/23/2014 1:40 PM

## 2014-02-23 NOTE — Patient Instructions (Signed)
Your lab results will be available to you in 7-10 days.   Keeping you healthy  Get these tests  Blood pressure- Have your blood pressure checked once a year by your healthcare provider.  Normal blood pressure is 120/80  Weight- Have your body mass index (BMI) calculated to screen for obesity.  BMI is a measure of body fat based on height and weight. You can also calculate your own BMI at [email or web address]/.  Cholesterol- Have your cholesterol checked every year.  Diabetes- Have your blood sugar checked regularly if you have high blood pressure, high cholesterol, have a family history of diabetes or if you are overweight.  Screening for Colon Cancer- Colonoscopy starting at age 58.  Screening may begin sooner depending on your family history and other health conditions. Follow up colonoscopy as directed by your Gastroenterologist.  Screening for Prostate Cancer- Both blood work (PSA) and a rectal exam help screen for Prostate Cancer.  Screening begins at age 58 with African-American men and at age 650 with Caucasian men.  Screening may begin sooner depending on your family history.  Take these medicines  Aspirin- One aspirin daily can help prevent Heart disease and Stroke.  Flu shot- Every fall.  Tetanus- Every 10 years.  Zostavax- Once after the age of 58 to prevent Shingles.  Pneumonia shot- Once after the age of 58; if you are younger than 2465, ask your healthcare provider if you need a Pneumonia shot.  Take these steps  Don't smoke- If you do smoke, talk to your doctor about quitting.  For tips on how to quit, go to www.smokefree.gov or call 1-800-QUIT-NOW.  Be physically active- Exercise 5 days a week for at least 30 minutes.  If you are not already physically active start slow and gradually work up to 30 minutes of moderate physical activity.  Examples of moderate activity include walking briskly, mowing the yard, dancing, swimming, bicycling, etc.  Eat a healthy diet-  Eat a variety of healthy food such as fruits, vegetables, low fat milk, low fat cheese, yogurt, lean meant, poultry, fish, beans, tofu, etc. For more information go to www.thenutritionsource.org  Drink alcohol in moderation- Limit alcohol intake to less than two drinks a day. Never drink and drive.  Dentist- Brush and floss twice daily; visit your dentist twice a year.  Depression- Your emotional health is as important as your physical health. If you're feeling down, or losing interest in things you would normally enjoy please talk to your healthcare provider.  Eye exam- Visit your eye doctor every year.  Safe sex- If you may be exposed to a sexually transmitted infection, use a condom.  Seat belts- Seat belts can save your life; always wear one.  Smoke/Carbon Monoxide detectors- These detectors need to be installed on the appropriate level of your home.  Replace batteries at least once a year.  Skin cancer- When out in the sun, cover up and use sunscreen 15 SPF or higher.  Violence- If anyone is threatening you, please tell your healthcare provider.  Living Will/ Health care power of attorney- Speak with your healthcare provider and family.

## 2014-02-24 ENCOUNTER — Telehealth: Payer: Self-pay | Admitting: Physician Assistant

## 2014-02-24 DIAGNOSIS — J309 Allergic rhinitis, unspecified: Secondary | ICD-10-CM | POA: Insufficient documentation

## 2014-02-24 LAB — HEPATITIS C ANTIBODY: HCV Ab: NEGATIVE

## 2014-02-24 NOTE — Progress Notes (Signed)
Patient discussed and examined with Mr. Chestine SporeClark. Agree with assessment and plan of care per his note.  Anticipatory guidance as in AVS.

## 2014-02-24 NOTE — Telephone Encounter (Signed)
Patient was called to discuss the results of his Lipid panel, as well as his other normal results.  It was made clear that his cholesterol and LDL were slightly elevated.   However, the 10 year risk calculator is < 7%, and, aside from being male, has no other risk factor for heart disease.  The lack of indication for a statin medication was discussed.  Exercise was again discussed, and the patient was advised to start an 81 mg ASA per day, given the A recommendation.  Patient was amenable to this plan.      Deliah BostonMichael Clark, MS, PA-C [facility] 02/24/2014 6:51 PM

## 2014-05-18 ENCOUNTER — Ambulatory Visit (INDEPENDENT_AMBULATORY_CARE_PROVIDER_SITE_OTHER): Payer: Managed Care, Other (non HMO) | Admitting: Family Medicine

## 2014-05-18 VITALS — BP 122/78 | HR 86 | Temp 98.7°F | Resp 17 | Ht 72.0 in | Wt 197.0 lb

## 2014-05-18 DIAGNOSIS — H6501 Acute serous otitis media, right ear: Secondary | ICD-10-CM

## 2014-05-18 DIAGNOSIS — J0100 Acute maxillary sinusitis, unspecified: Secondary | ICD-10-CM

## 2014-05-18 MED ORDER — PREDNISONE 20 MG PO TABS: ORAL_TABLET | ORAL | Status: DC

## 2014-05-18 MED ORDER — AZITHROMYCIN 250 MG PO TABS: ORAL_TABLET | ORAL | Status: DC

## 2014-05-18 NOTE — Progress Notes (Signed)
Subjective:    Patient ID: Colton Suarez, male    DOB: [birth year], 58 y.o.   MRN: [id number]  This chart was scribed for Sherren MochaEva N Shaw by Ronney LionSuzanne Le, ED Scribe. This patient was seen in room 3 and the patient's care was started at 9:54 AM.    Chief Complaint  Patient presents with  . URI  . Sinusitis  . Nasal Congestion    HPI  HPI Comments: Colton Suarez is a 58 y.o. male who presents to the Emergency Department complaining of sinus pressure and pain, mainly on the frontal head, (more right-sided today, though yesterday more on the left), onset 3 days ago. He endorses a subjective fever, yellow nasal congestion without blood, productive coughing, ear congestion, and nausea. Patient hasn't been regularly taking Zyrtec or Flonase, but restarted Zyrtec and Flonase over the past 3 days. He denies trying OTC medication. He reports a reaction of pancreatitis to a steroid in the past, but he doesn't remember which (per medical records, saw Dr. Georgiana ShoreMayans for sinusitis in February 2013; he was given Amoxicillin that didn't work, and prednisone taper, which seems to have not caused issue; he was then given Z-pak, atropine nasal spray, and hycodan--all of which seemed to work, as patient did not return for 5 months). Patient denies sore throat, vomiting, constipation, diarrhea, and urinary changes.  Past Medical History  Diagnosis Date  . Allergy    Current Outpatient Prescriptions on File Prior to Visit  Medication Sig Dispense Refill  . cetirizine (ZYRTEC) 10 MG tablet Take 10 mg by mouth daily.    . fluticasone (FLONASE) 50 MCG/ACT nasal spray Place 2 sprays into both nostrils daily. 16 g 12   No current facility-administered medications on file prior to visit.   Allergies  Allergen Reactions  . Other     STEROIDS: Does not know name, however the reaction lead to pancreatitis.      Review of Systems  Constitutional: Positive for fever.  HENT: Positive for congestion and sinus pressure.  Negative for sore throat.   Respiratory: Positive for cough.   Gastrointestinal: Positive for nausea. Negative for vomiting, diarrhea and constipation.  Genitourinary: Negative for dysuria, urgency, frequency, hematuria, decreased urine volume and difficulty urinating.       Objective:  BP 122/78 mmHg  Pulse 86  Temp(Src) 98.7 F (37.1 C) (Oral)  Resp 17  Ht 6' (1.829 m)  Wt 197 lb (89.359 kg)  BMI 26.71 kg/m2  SpO2 98%   Physical Exam  Constitutional: He is oriented to person, place, and time. He appears well-developed and well-nourished. No distress.  HENT:  Head: Normocephalic and atraumatic.  Right Ear: A middle ear effusion is present.  Left Ear: Tympanic membrane, external ear and ear canal normal.  Nose: Septal deviation (to the right) present.  Mouth/Throat: Oropharynx is clear and moist. No oropharyngeal exudate.  Large mid ear effusion and opacification of right ear. Severe nasal congestion and inflammation with purulent rhinitis.  Eyes: Conjunctivae and EOM are normal.  Neck: Neck supple. No tracheal deviation present. No thyromegaly present.  Cardiovascular: Normal rate, regular rhythm and normal heart sounds.   No murmur heard. Pulmonary/Chest: Effort normal. No respiratory distress.  Good air movement throughout. Lungs clear to ausculation bilaterally.   Musculoskeletal: Normal range of motion.  Lymphadenopathy:       Head (right side): Tonsillar adenopathy present.       Head (left side): Tonsillar adenopathy present.    He has no cervical  adenopathy.       Right: No supraclavicular adenopathy present.       Left: No supraclavicular adenopathy present.  Neurological: He is alert and oriented to person, place, and time.  Skin: Skin is warm and dry.  Psychiatric: He has a normal mood and affect. His behavior is normal.  Nursing note and vitals reviewed.         Assessment & Plan:    10:11 AM-Discussed treatment plan which includes continuing Flonase,  Prednisone (informed pt that he can stop if needed, as there are possible side effects of increased irritability, appetite, and problem sleeping), and Z-pak, with pt and pt agreed to plan. He denies a narcotic cough medication at this time. But ok to call in if needed. Return to clinic if not significant improvement in 1 week, or complete improvement in 2 weeks.  Acute maxillary sinusitis, recurrence not specified  Right acute serous otitis media, recurrence not specified  Meds ordered this encounter  Medications  . azithromycin (ZITHROMAX) 250 MG tablet    Sig: Take 2 tabs PO x 1 dose, then 1 tab PO QD x 4 days    Dispense:  6 tablet    Refill:  0  . predniSONE (DELTASONE) 20 MG tablet    Sig: 3 tabs x 2d, 2 tabs x 2d, 1 tab x 2d, ok to stop early if side effects    Dispense:  12 tablet    Refill:  0    I personally performed the services described in this documentation, which was scribed in my presence. The recorded information has been reviewed and considered, and addended by me as needed.  Norberto SorensonEva Shaw, MD MPH

## 2014-05-18 NOTE — Patient Instructions (Signed)
Serous Otitis Media Serous otitis media is fluid in the middle ear space. This space contains the bones for hearing and air. Air in the middle ear space helps to transmit sound.  The air gets there through the eustachian tube. This tube goes from the back of the nose (nasopharynx) to the middle ear space. It keeps the pressure in the middle ear the same as the outside world. It also helps to drain fluid from the middle ear space. CAUSES  Serous otitis media occurs when the eustachian tube gets blocked. Blockage can come from:  Ear infections.  Colds and other upper respiratory infections.  Allergies.  Irritants such as cigarette smoke.  Sudden changes in air pressure (such as descending in an airplane).  Enlarged adenoids.  A mass in the nasopharynx. During colds and upper respiratory infections, the middle ear space can become temporarily filled with fluid. This can happen after an ear infection also. Once the infection clears, the fluid will generally drain out of the ear through the eustachian tube. If it does not, then serous otitis media occurs. SIGNS AND SYMPTOMS   Hearing loss.  A feeling of fullness in the ear, without pain.  Young children may not show any symptoms but may show slight behavioral changes, such as agitation, ear pulling, or crying. DIAGNOSIS  Serous otitis media is diagnosed by an ear exam. Tests may be done to check on the movement of the eardrum. Hearing exams may also be done. TREATMENT  The fluid most often goes away without treatment. If allergy is the cause, allergy treatment may be helpful. Fluid that persists for several months may require minor surgery. A small tube is placed in the eardrum to:  Drain the fluid.  Restore the air in the middle ear space. In certain situations, antibiotic medicines are used to avoid surgery. Surgery may be done to remove enlarged adenoids (if this is the cause). HOME CARE INSTRUCTIONS   Keep children away from  tobacco smoke.  Keep all follow-up visits as directed by your health care provider. SEEK MEDICAL CARE IF:   Your hearing is not better in 3 months.  Your hearing is worse.  You have ear pain.  You have drainage from the ear.  You have dizziness.  You have serous otitis media only in one ear or have any bleeding from your nose (epistaxis).  You notice a lump on your neck. MAKE SURE YOU:  Understand these instructions.   Will watch your condition.   Will get help right away if you are not doing well or get worse.  Document Released: 07/28/2003 Document Revised: 09/21/2013 Document Reviewed: 12/02/2012 ExitCare Patient Information 2015 [employer]. This information is not intended to replace advice given to you by your health care provider. Make sure you discuss any questions you have with your health care provider. Sinusitis Sinusitis is redness, soreness, and inflammation of the paranasal sinuses. Paranasal sinuses are air pockets within the bones of your face (beneath the eyes, the middle of the forehead, or above the eyes). In healthy paranasal sinuses, mucus is able to drain out, and air is able to circulate through them by way of your nose. However, when your paranasal sinuses are inflamed, mucus and air can become trapped. This can allow bacteria and other germs to grow and cause infection. Sinusitis can develop quickly and last only a short time (acute) or continue over a long period (chronic). Sinusitis that lasts for more than 12 weeks is considered chronic.  CAUSES    Causes of sinusitis include:  Allergies.  Structural abnormalities, such as displacement of the cartilage that separates your nostrils (deviated septum), which can decrease the air flow through your nose and sinuses and affect sinus drainage.  Functional abnormalities, such as when the small hairs (cilia) that line your sinuses and help remove mucus do not work properly or are not present. SIGNS AND  SYMPTOMS  Symptoms of acute and chronic sinusitis are the same. The primary symptoms are pain and pressure around the affected sinuses. Other symptoms include:  Upper toothache.  Earache.  Headache.  Bad breath.  Decreased sense of smell and taste.  A cough, which worsens when you are lying flat.  Fatigue.  Fever.  Thick drainage from your nose, which often is green and may contain pus (purulent).  Swelling and warmth over the affected sinuses. DIAGNOSIS  Your health care provider will perform a physical exam. During the exam, your health care provider may:  Look in your nose for signs of abnormal growths in your nostrils (nasal polyps).  Tap over the affected sinus to check for signs of infection.  View the inside of your sinuses (endoscopy) using an imaging device that has a light attached (endoscope). If your health care provider suspects that you have chronic sinusitis, one or more of the following tests may be recommended:  Allergy tests.  Nasal culture. A sample of mucus is taken from your nose, sent to a lab, and screened for bacteria.  Nasal cytology. A sample of mucus is taken from your nose and examined by your health care provider to determine if your sinusitis is related to an allergy. TREATMENT  Most cases of acute sinusitis are related to a viral infection and will resolve on their own within 10 days. Sometimes medicines are prescribed to help relieve symptoms (pain medicine, decongestants, nasal steroid sprays, or saline sprays).  However, for sinusitis related to a bacterial infection, your health care provider will prescribe antibiotic medicines. These are medicines that will help kill the bacteria causing the infection.  Rarely, sinusitis is caused by a fungal infection. In theses cases, your health care provider will prescribe antifungal medicine. For some cases of chronic sinusitis, surgery is needed. Generally, these are cases in which sinusitis recurs  more than 3 times per year, despite other treatments. HOME CARE INSTRUCTIONS   Drink plenty of water. Water helps thin the mucus so your sinuses can drain more easily.  Use a humidifier.  Inhale steam 3 to 4 times a day (for example, sit in the bathroom with the shower running).  Apply a warm, moist washcloth to your face 3 to 4 times a day, or as directed by your health care provider.  Use saline nasal sprays to help moisten and clean your sinuses.  Take medicines only as directed by your health care provider.  If you were prescribed either an antibiotic or antifungal medicine, finish it all even if you start to feel better. SEEK IMMEDIATE MEDICAL CARE IF:  You have increasing pain or severe headaches.  You have nausea, vomiting, or drowsiness.  You have swelling around your face.  You have vision problems.  You have a stiff neck.  You have difficulty breathing. MAKE SURE YOU:   Understand these instructions.  Will watch your condition.  Will get help right away if you are not doing well or get worse. Document Released: 05/07/2005 Document Revised: 09/21/2013 Document Reviewed: 05/22/2011 ExitCare Patient Information 2015 [employer]. This information is not intended to replace   advice given to you by your health care provider. Make sure you discuss any questions you have with your health care provider.  

## 2014-08-11 ENCOUNTER — Ambulatory Visit (INDEPENDENT_AMBULATORY_CARE_PROVIDER_SITE_OTHER): Payer: Self-pay | Admitting: Emergency Medicine

## 2014-08-11 VITALS — BP 108/64 | HR 66 | Temp 97.5°F | Resp 16 | Ht 70.5 in | Wt 195.6 lb

## 2014-08-11 DIAGNOSIS — Z029 Encounter for administrative examinations, unspecified: Secondary | ICD-10-CM

## 2014-08-11 DIAGNOSIS — Z021 Encounter for pre-employment examination: Secondary | ICD-10-CM

## 2014-08-11 NOTE — Progress Notes (Signed)
[facility] [address], [place] [id number] [phone]  Date:  08/11/2014   Name:  Colton Suarez   DOB:  13-Dec-1955   MRN:  [id number]  PCP:  No primary care provider on file.    Chief Complaint: DOT   History of Present Illness:  Colton Suarez is a 59 y.o. very pleasant male patient who presents with the following:  DOT   Patient Active Problem List   Diagnosis Date Noted  . Allergic rhinitis 02/24/2014    Past Medical History  Diagnosis Date  . Allergy     Past Surgical History  Procedure Laterality Date  . Tonsillectomy and adenoidectomy      at 59yo  . Throat surgery      tosilectomy    History  Substance Use Topics  . Smoking status: Never Smoker   . Smokeless tobacco: Never Used  . Alcohol Use: Yes     Comment: social    Family History  Problem Relation Age of Onset  . Hypertension Father     Allergies  Allergen Reactions  . Other     STEROIDS: Does not know name, however the reaction lead to pancreatitis.     Medication list has been reviewed and updated.  Current Outpatient Prescriptions on File Prior to Visit  Medication Sig Dispense Refill  . azithromycin (ZITHROMAX) 250 MG tablet Take 2 tabs PO x 1 dose, then 1 tab PO QD x 4 days (Patient not taking: Reported on 08/11/2014) 6 tablet 0  . cetirizine (ZYRTEC) 10 MG tablet Take 10 mg by mouth daily.    . fluticasone (FLONASE) 50 MCG/ACT nasal spray Place 2 sprays into both nostrils daily. (Patient not taking: Reported on 08/11/2014) 16 g 12  . predniSONE (DELTASONE) 20 MG tablet 3 tabs x 2d, 2 tabs x 2d, 1 tab x 2d, ok to stop early if side effects (Patient not taking: Reported on 08/11/2014) 12 tablet 0   No current facility-administered medications on file prior to visit.    Review of Systems:  As per HPI, otherwise negative.    Physical Examination: Filed Vitals:   08/11/14 1540  BP: 108/64  Pulse: 66  Temp: 97.5 F (36.4 C)  Resp: 16   Filed  Vitals:   08/11/14 1540  Height: 5' 10.5" (1.791 m)  Weight: 195 lb 9.6 oz (88.724 kg)   Body mass index is 27.66 kg/(m^2). Ideal Body Weight: Weight in (lb) to have BMI = 25: 176.4  GEN: WDWN, NAD, Non-toxic, A & O x 3 HEENT: Atraumatic, Normocephalic. Neck supple. No masses, No LAD. Ears and Nose: No external deformity. CV: RRR, No M/G/R. No JVD. No thrill. No extra heart sounds. PULM: CTA B, no wheezes, crackles, rhonchi. No retractions. No resp. distress. No accessory muscle use. ABD: S, NT, ND, +BS. No rebound. No HSM. EXTR: No c/c/e NEURO Normal gait.  PSYCH: Normally interactive. Conversant. Not depressed or anxious appearing.  Calm demeanor.    Assessment and Plan: DOT 2 year  Signed,  Phillips OdorJeffery Gladys Deckard, MD

## 2014-10-31 ENCOUNTER — Ambulatory Visit (INDEPENDENT_AMBULATORY_CARE_PROVIDER_SITE_OTHER): Payer: Managed Care, Other (non HMO) | Admitting: Family Medicine

## 2014-10-31 VITALS — BP 120/74 | HR 73 | Temp 98.6°F | Resp 18 | Ht 70.0 in | Wt 198.0 lb

## 2014-10-31 DIAGNOSIS — L237 Allergic contact dermatitis due to plants, except food: Secondary | ICD-10-CM

## 2014-10-31 MED ORDER — METHYLPREDNISOLONE ACETATE 80 MG/ML IJ SUSP
80.0000 mg | Freq: Once | INTRAMUSCULAR | Status: AC
  Administered 2014-10-31: 80 mg via INTRAMUSCULAR

## 2014-10-31 MED ORDER — METHYLPREDNISOLONE ACETATE 40 MG/ML INJ SUSP (RADIOLOG: 80.0000 mg | Freq: Once | INTRAMUSCULAR | Status: DC

## 2014-10-31 NOTE — Progress Notes (Signed)
Patient ID: Colton Suarez, male   DOB: August 10, 1955, 59 y.o.   MRN: [id number]   This chart was scribed for Elvina Sidle, MD by [facility], medical scribe at [facility].The patient was seen in exam room 01 and the patient's care was started at 11:34 AM.  Patient ID: Colton Suarez MRN: [id number], DOB: [birth year], 59 y.o. Date of Encounter: 10/31/2014  Primary Physician: No primary care provider on file.  Chief Complaint:  Chief Complaint  Patient presents with  . Poison Ivy    arms and legs x4 days   . Edema    left foot since yesterday    HPI:  Colton Suarez is a 59 y.o. male who presents to [facility] complaining of a rash on his upper and lower extremities with associated edema, onset four days ago. His left foot is swollen. No rash in between his digits. He was working in the yard, and most likely came in contact with poison ivy. Pt works in Data processing manager, he has two children and two-step children. His step daughter was married this weekend.   Past Medical History  Diagnosis Date  . Allergy     Home Meds: Prior to Admission medications   Medication Sig Start Date End Date Taking? Authorizing Provider  azithromycin (ZITHROMAX) 250 MG tablet Take 2 tabs PO x 1 dose, then 1 tab PO QD x 4 days Patient not taking: Reported on 08/11/2014 05/18/14   Sherren Mocha, MD  cetirizine (ZYRTEC) 10 MG tablet Take 10 mg by mouth daily.    Historical Provider, MD  fluticasone (FLONASE) 50 MCG/ACT nasal spray Place 2 sprays into both nostrils daily. Patient not taking: Reported on 08/11/2014 02/23/14   Ofilia Neas, PA-C  predniSONE (DELTASONE) 20 MG tablet 3 tabs x 2d, 2 tabs x 2d, 1 tab x 2d, ok to stop early if side effects Patient not taking: Reported on 08/11/2014 05/18/14   Sherren Mocha, MD   Allergies:  Allergies  Allergen Reactions  . Other     STEROIDS: Does not know name, however the reaction lead to pancreatitis.    History   Social History    . Marital Status: Single    Spouse Name: N/A  . Number of Children: N/A  . Years of Education: N/A   Occupational History  . Not on file.   Social History Main Topics  . Smoking status: Never Smoker   . Smokeless tobacco: Never Used  . Alcohol Use: Yes     Comment: social  . Drug Use: No  . Sexual Activity: Not on file   Other Topics Concern  . Not on file   Social History Narrative    Review of Systems: Constitutional: negative for chills, fever, night sweats, weight changes, or fatigue  HEENT: negative for vision changes, hearing loss, congestion, rhinorrhea, ST, epistaxis, or sinus pressure Cardiovascular: negative for chest pain or palpitations Respiratory: negative for hemoptysis, wheezing, shortness of breath, or cough Abdominal: negative for abdominal pain, nausea, vomiting, diarrhea, or constipation Dermatological: positive for rash Neurologic: negative for headache, dizziness, or syncope All other systems reviewed and are otherwise negative with the exception to those above and in the HPI.  Physical Exam: Blood pressure 120/74, pulse 73, temperature 98.6 F (37 C), temperature source Oral, resp. rate 18, height  (1.778 m), weight 198 lb (89.812 kg), SpO2 99 %., Body mass index is 28.41 kg/(m^2). General: Well developed, well nourished, in  no acute distress. Head: Normocephalic, atraumatic, eyes without discharge, sclera non-icteric, nares are without discharge. Bilateral auditory canals clear, TM's are without perforation, pearly grey and translucent with reflective cone of light bilaterally. Oral cavity moist, posterior pharynx without exudate, erythema, peritonsillar abscess, or post nasal drip.  Neck: Supple. No thyromegaly. Full ROM. No lymphadenopathy. Lungs: Clear bilaterally to auscultation without wheezes, rales, or rhonchi. Breathing is unlabored. Heart: RRR with S1 S2. No murmurs, rubs, or gallops appreciated. Abdomen: Soft, non-tender, non-distended  with normoactive bowel sounds. No hepatomegaly. No rebound/guarding. No obvious abdominal masses. Msk:  Strength and tone normal for age. Extremities/Skin: Warm and dry. Scattered linear papules upper and lower extremities. Also has a swollen feet worse on the left. Neuro: Alert and oriented X 3. Moves all extremities spontaneously. Gait is normal. CNII-XII grossly in tact. Psych:  Responds to questions appropriately with a normal affect.    ASSESSMENT AND PLAN:  59 y.o. year old male with   This chart was scribed in my presence and reviewed by me personally.    ICD-9-CM ICD-10-CM   1. Poison ivy 692.6 L23.7 methylPREDNISolone acetate (DEPO-MEDROL) injection 80 mg     DISCONTINUED: methylPREDNISolone acetate (DEPO-MEDROL) injection (RADIOLOGY ONLY) 80 mg    Signed, Elvina Sidle, MD 10/31/2014 11:34 AM

## 2014-10-31 NOTE — Patient Instructions (Signed)

## 2015-01-17 ENCOUNTER — Ambulatory Visit (INDEPENDENT_AMBULATORY_CARE_PROVIDER_SITE_OTHER): Payer: Managed Care, Other (non HMO) | Admitting: Internal Medicine

## 2015-01-17 VITALS — BP 102/66 | HR 69 | Temp 97.7°F | Resp 16 | Ht 70.0 in | Wt 193.0 lb

## 2015-01-17 DIAGNOSIS — J0140 Acute pansinusitis, unspecified: Secondary | ICD-10-CM

## 2015-01-17 DIAGNOSIS — J200 Acute bronchitis due to Mycoplasma pneumoniae: Secondary | ICD-10-CM | POA: Diagnosis not present

## 2015-01-17 MED ORDER — HYDROCODONE-ACETAMINOPHEN 7.5-325 MG/15ML PO SOLN: 5.0000 mL | Freq: Four times a day (QID) | ORAL | Status: DC | PRN

## 2015-01-17 MED ORDER — AZITHROMYCIN 500 MG PO TABS: 500.0000 mg | ORAL_TABLET | Freq: Every day | ORAL | Status: DC

## 2015-01-17 NOTE — Patient Instructions (Signed)
Cough, Adult  A cough is a reflex that helps clear your throat and airways. It can help heal the body or may be a reaction to an irritated airway. A cough may only last 2 or 3 weeks (acute) or may last more than 8 weeks (chronic).  CAUSES Acute cough:  Viral or bacterial infections. Chronic cough:  Infections.  Allergies.  Asthma.  Post-nasal drip.  Smoking.  Heartburn or acid reflux.  Some medicines.  Chronic lung problems (COPD).  Cancer. SYMPTOMS   Cough.  Fever.  Chest pain.  Increased breathing rate.  High-pitched whistling sound when breathing (wheezing).  Colored mucus that you cough up (sputum). TREATMENT   A bacterial cough may be treated with antibiotic medicine.  A viral cough must run its course and will not respond to antibiotics.  Your caregiver may recommend other treatments if you have a chronic cough. HOME CARE INSTRUCTIONS   Only take over-the-counter or prescription medicines for pain, discomfort, or fever as directed by your caregiver. Use cough suppressants only as directed by your caregiver.  Use a cold steam vaporizer or humidifier in your bedroom or home to help loosen secretions.  Sleep in a semi-upright position if your cough is worse at night.  Rest as needed.  Stop smoking if you smoke. SEEK IMMEDIATE MEDICAL CARE IF:   You have pus in your sputum.  Your cough starts to worsen.  You cannot control your cough with suppressants and are losing sleep.  You begin coughing up blood.  You have difficulty breathing.  You develop pain which is getting worse or is uncontrolled with medicine.  You have a fever. MAKE SURE YOU:   Understand these instructions.  Will watch your condition.  Will get help right away if you are not doing well or get worse. Document Released: 11/03/2010 Document Revised: 07/30/2011 Document Reviewed: 11/03/2010 ExitCare Patient Information 2015 [employer]. This information is not intended  to replace advice given to you by your health care provider. Make sure you discuss any questions you have with your health care provider. Sinusitis Sinusitis is redness, soreness, and inflammation of the paranasal sinuses. Paranasal sinuses are air pockets within the bones of your face (beneath the eyes, the middle of the forehead, or above the eyes). In healthy paranasal sinuses, mucus is able to drain out, and air is able to circulate through them by way of your nose. However, when your paranasal sinuses are inflamed, mucus and air can become trapped. This can allow bacteria and other germs to grow and cause infection. Sinusitis can develop quickly and last only a short time (acute) or continue over a long period (chronic). Sinusitis that lasts for more than 12 weeks is considered chronic.  CAUSES  Causes of sinusitis include:  Allergies.  Structural abnormalities, such as displacement of the cartilage that separates your nostrils (deviated septum), which can decrease the air flow through your nose and sinuses and affect sinus drainage.  Functional abnormalities, such as when the small hairs (cilia) that line your sinuses and help remove mucus do not work properly or are not present. SIGNS AND SYMPTOMS  Symptoms of acute and chronic sinusitis are the same. The primary symptoms are pain and pressure around the affected sinuses. Other symptoms include:  Upper toothache.  Earache.  Headache.  Bad breath.  Decreased sense of smell and taste.  A cough, which worsens when you are lying flat.  Fatigue.  Fever.  Thick drainage from your nose, which often is green and   may contain pus (purulent).  Swelling and warmth over the affected sinuses. DIAGNOSIS  Your health care provider will perform a physical exam. During the exam, your health care provider may:  Look in your nose for signs of abnormal growths in your nostrils (nasal polyps).  Tap over the affected sinus to check for signs  of infection.  View the inside of your sinuses (endoscopy) using an imaging device that has a light attached (endoscope). If your health care provider suspects that you have chronic sinusitis, one or more of the following tests may be recommended:  Allergy tests.  Nasal culture. A sample of mucus is taken from your nose, sent to a lab, and screened for bacteria.  Nasal cytology. A sample of mucus is taken from your nose and examined by your health care provider to determine if your sinusitis is related to an allergy. TREATMENT  Most cases of acute sinusitis are related to a viral infection and will resolve on their own within 10 days. Sometimes medicines are prescribed to help relieve symptoms (pain medicine, decongestants, nasal steroid sprays, or saline sprays).  However, for sinusitis related to a bacterial infection, your health care provider will prescribe antibiotic medicines. These are medicines that will help kill the bacteria causing the infection.  Rarely, sinusitis is caused by a fungal infection. In theses cases, your health care provider will prescribe antifungal medicine. For some cases of chronic sinusitis, surgery is needed. Generally, these are cases in which sinusitis recurs more than 3 times per year, despite other treatments. HOME CARE INSTRUCTIONS   Drink plenty of water. Water helps thin the mucus so your sinuses can drain more easily.  Use a humidifier.  Inhale steam 3 to 4 times a day (for example, sit in the bathroom with the shower running).  Apply a warm, moist washcloth to your face 3 to 4 times a day, or as directed by your health care provider.  Use saline nasal sprays to help moisten and clean your sinuses.  Take medicines only as directed by your health care provider.  If you were prescribed either an antibiotic or antifungal medicine, finish it all even if you start to feel better. SEEK IMMEDIATE MEDICAL CARE IF:  You have increasing pain or severe  headaches.  You have nausea, vomiting, or drowsiness.  You have swelling around your face.  You have vision problems.  You have a stiff neck.  You have difficulty breathing. MAKE SURE YOU:   Understand these instructions.  Will watch your condition.  Will get help right away if you are not doing well or get worse. Document Released: 05/07/2005 Document Revised: 09/21/2013 Document Reviewed: 05/22/2011 ExitCare Patient Information 2015 [employer]. This information is not intended to replace advice given to you by your health care provider. Make sure you discuss any questions you have with your health care provider.  

## 2015-01-17 NOTE — Progress Notes (Signed)
Patient ID: Colton Suarez, male   DOB: 1955/06/13, 59 y.o.   MRN: [id number]   01/17/2015 at 2:37 PM  Colton Suarez / DOB: 27-Oct-1955 / MRN: [id number]  Problem list reviewed and updated by me where necessary.   SUBJECTIVE  Colton Suarez is a 59 y.o. ill appearing male presenting for the chief complaint of cough, chest congestion,.sinus pain, increasing mucus, facial pain, low grade fever going on for 1-2 weeks. No sob, cp.   He  has a past medical history of Allergy.    Medications reviewed and updated by myself where necessary, and exist elsewhere in the encounter.   Colton Suarez is allergic to other. He  reports that he has never smoked. He has never used smokeless tobacco. He reports that he drinks alcohol. He reports that he does not use illicit drugs. He  has no sexual activity history on file. The patient  has past surgical history that includes Tonsillectomy and adenoidectomy and Throat surgery.  His family history includes Hypertension in his father.  Review of Systems  Constitutional: Positive for fever. Negative for weight loss.  HENT: Positive for congestion and sore throat.   Eyes: Negative.   Respiratory: Positive for cough and sputum production. Negative for shortness of breath.   Cardiovascular: Negative for chest pain.  Musculoskeletal: Negative for neck pain.  Skin: Negative for rash.  Neurological: Positive for headaches. Negative for weakness.    OBJECTIVE  His  height is  (1.778 m) and weight is 193 lb (87.544 kg). His oral temperature is 97.7 F (36.5 C). His blood pressure is 102/66 and his pulse is 69. His respiration is 16 and oxygen saturation is 98%.  The patient's body mass index is 27.69 kg/(m^2).  Physical Exam  Constitutional: He is oriented to person, place, and time. He appears well-developed and well-nourished.  HENT:  Head: Normocephalic.  Right Ear: External ear normal.  Left Ear: External ear normal.  Nose: Mucosal edema, rhinorrhea  and sinus tenderness present. Right sinus exhibits maxillary sinus tenderness. Right sinus exhibits no frontal sinus tenderness. Left sinus exhibits maxillary sinus tenderness. Left sinus exhibits no frontal sinus tenderness.  Mouth/Throat: Oropharynx is clear and moist.  Eyes: EOM are normal.  Neck: Normal range of motion.  Cardiovascular: Normal rate, regular rhythm and normal heart sounds.   Respiratory: Effort normal. No respiratory distress. He has no wheezes. He exhibits no tenderness.  Neurological: He is alert and oriented to person, place, and time. He exhibits normal muscle tone. Coordination normal.  Psychiatric: He has a normal mood and affect.    No results found for this or any previous visit (from the past 24 hour(s)).  ASSESSMENT & PLAN  [place] was seen today for nasal congestion, sinusitis and sore throat.  Diagnoses and all orders for this visit:  Subacute pansinusitis -     azithromycin (ZITHROMAX) 500 MG tablet; Take 1 tablet (500 mg total) by mouth daily. -     HYDROcodone-acetaminophen (HYCET) 7.5-325 mg/15 ml solution; Take 5 mLs by mouth every 6 (six) hours as needed (or cough).  Acute bronchitis due to Mycoplasma pneumoniae -     azithromycin (ZITHROMAX) 500 MG tablet; Take 1 tablet (500 mg total) by mouth daily. -     HYDROcodone-acetaminophen (HYCET) 7.5-325 mg/15 ml solution; Take 5 mLs by mouth every 6 (six) hours as needed (or cough).

## 2015-05-26 ENCOUNTER — Ambulatory Visit (INDEPENDENT_AMBULATORY_CARE_PROVIDER_SITE_OTHER): Payer: Managed Care, Other (non HMO) | Admitting: Family Medicine

## 2015-05-26 VITALS — BP 128/68 | HR 87 | Temp 99.3°F | Resp 18 | Ht 69.0 in | Wt 193.6 lb

## 2015-05-26 DIAGNOSIS — J0140 Acute pansinusitis, unspecified: Secondary | ICD-10-CM

## 2015-05-26 MED ORDER — AZITHROMYCIN 250 MG PO TABS: ORAL_TABLET | ORAL | Status: DC

## 2015-05-26 MED ORDER — AMOXICILLIN 500 MG PO CAPS: 500.0000 mg | ORAL_CAPSULE | Freq: Three times a day (TID) | ORAL | Status: DC

## 2015-05-26 NOTE — Patient Instructions (Signed)
I think you have a sinus infection.  I have sent in an antibiotic for you.  Take 2 pills today and 1 pill daily after that until gone.    Feel better  If you have any shortness of breath, fevers, chest pain, or worsening symptoms, please come back immediately or go to the ER after hours.  It was good to see you today!

## 2015-05-26 NOTE — Progress Notes (Signed)
Colton Suarez is a 60 y.o. male who presents [facility] today for URI symptoms.  1.  SUBJECTIVE: URI symptoms:  Colton Suarez is a 60 y.o. male who complains of URI symptoms present for past 4-5 days.  Describes rhinorrhea, intense sinus congestion, mild cough worse at night, subjective fevers at home.  Hasn't tried any OTC medications.  Sick contacts are other co-workers.  Also with chills. No nausea or vomiting.  Denies smoking cigarettes.  Eating and drinking well.    ROS:  As above.   OBJECTIVE: BP 128/68 mmHg  Pulse 87  Temp(Src) 99.3 F (37.4 C) (Oral)  Resp 18  Ht 5\' 9"  (1.753 m)  Wt 193 lb 9.6 oz (87.816 kg)  BMI 28.58 kg/m2  SpO2 92% Gen:  Patient sitting on exam table, appears stated age in no acute distress Head: Normocephalic atraumatic Eyes: EOMI, PERRL, sclera and conjunctiva non-erythematous Ears:  Canals clear bilaterally.  TMs pearly gray bilaterally, does have some retraction of TM on Left and fluid behind TM of right.  Nose:  Nasal turbinates grossly enlarged bilaterally. Some exudates noted. Tender to palpation of maxillary sinus  Mouth: Mucosa membranes moist. Tonsils +2, nonenlarged, non-erythematous.  Throat and pharynx normal appearing. Neck: No cervical lymphadenopathy noted Heart:  RRR, no murmurs auscultated. Pulm:  Clear to auscultation bilaterally with good air movement.  No wheezes or rales noted.     Assessment and Plan:  1.  Likely sinusitis: Plan to treat due to length of symptoms and no improvement.   Initially sent in Amoxicillin -- however patient states this medicine never worked for him in past.   Will therefore prescribe Azithromycin x 5 days. Instructed patient to return in 1 week for checkup or sooner if worsening or no improvement.

## 2015-06-10 ENCOUNTER — Ambulatory Visit (INDEPENDENT_AMBULATORY_CARE_PROVIDER_SITE_OTHER): Payer: Managed Care, Other (non HMO) | Admitting: Family Medicine

## 2015-06-10 VITALS — BP 126/74 | HR 72 | Temp 98.2°F | Resp 17 | Ht 69.5 in | Wt 193.0 lb

## 2015-06-10 DIAGNOSIS — J40 Bronchitis, not specified as acute or chronic: Secondary | ICD-10-CM | POA: Diagnosis not present

## 2015-06-10 DIAGNOSIS — J329 Chronic sinusitis, unspecified: Secondary | ICD-10-CM

## 2015-06-10 DIAGNOSIS — J069 Acute upper respiratory infection, unspecified: Secondary | ICD-10-CM | POA: Diagnosis not present

## 2015-06-10 MED ORDER — LEVOFLOXACIN 750 MG PO TABS: 750.0000 mg | ORAL_TABLET | Freq: Every day | ORAL | Status: DC

## 2015-06-10 NOTE — Addendum Note (Signed)
Addended by: Meredith Staggers R on: 06/10/2015 12:16 PM   Modules accepted: Orders

## 2015-06-10 NOTE — Progress Notes (Addendum)
Subjective:  By signing my name below, I, Raven Small, attest that this documentation has been prepared under the direction and in the presence of Meredith Staggers, MD.  Electronically Signed: Andrew Au, ED Scribe. 06/10/2015. 11:30 AM.  Patient ID: Colton Suarez, male    DOB: [birth year], 60 y.o.   MRN: [id number]  HPI   Chief Complaint  Patient presents with  . Follow-up  . Sinusitis   HPI Comments: Colton Suarez is a 60 y.o. male who presents to the [facility] for follow up. He was seen 15 days ago by Dr. Gwendolyn Grant. He was prescribed 5 day coarse of azithromycin for possible sinusitis. Treated for sinusitis and bronchitis in 12/2014 with azithromycin.   Symptoms started 4 days prior to seeing Dr. Gwendolyn Grant 15 days ago. He has finished z-pak. States symptoms were getting better he but began to worse yesterday. He's had persistent nasal congestion with clear drainage, which was initially discolored. Yesterday he developed watery eyes, cough, chest congestion, sneezing, mild subjective low grade fever and recurrent sinus pressure . He has been taking Flonase and zyrtec. He has had sick contacts at work. He denies drug allergies. No nausea and emesis.     Immunization History  Administered Date(s) Administered  . Influenza Split 03/15/2012  . Influenza,inj,Quad PF,36+ Mos 02/10/2013, 02/23/2014  . Tdap 03/02/2011    Patient Active Problem List   Diagnosis Date Noted  . Allergic rhinitis 02/24/2014   Past Medical History  Diagnosis Date  . Allergy    Past Surgical History  Procedure Laterality Date  . Tonsillectomy and adenoidectomy      at 60yo  . Throat surgery      tosilectomy   Allergies  Allergen Reactions  . Other     STEROIDS: Does not know name, however the reaction lead to pancreatitis.    Prior to Admission medications   Medication Sig Start Date End Date Taking? Authorizing Provider  cetirizine (ZYRTEC) 10 MG tablet Take 10 mg by mouth daily.    Yes Historical Provider, MD  fluticasone (FLONASE) 50 MCG/ACT nasal spray Place 2 sprays into both nostrils daily. 02/23/14  Yes Ofilia Neas, PA-C  HYDROcodone-acetaminophen (HYCET) 7.5-325 mg/15 ml solution Take 5 mLs by mouth every 6 (six) hours as needed (or cough). 01/17/15  Yes Jonita Albee, MD  azithromycin (ZITHROMAX) 250 MG tablet Take 2 tabs po once and 1 tab daily after that. Patient not taking: Reported on 06/10/2015 05/26/15   Tobey Grim, MD   Social History   Social History  . Marital Status: Single    Spouse Name: N/A  . Number of Children: N/A  . Years of Education: N/A   Occupational History  . Not on file.   Social History Main Topics  . Smoking status: Never Smoker   . Smokeless tobacco: Never Used  . Alcohol Use: Yes     Comment: social  . Drug Use: No  . Sexual Activity: Not on file   Other Topics Concern  . Not on file   Social History Narrative   Review of Systems  Constitutional: Positive for fever ( subjective). Negative for chills.  HENT: Positive for congestion, sinus pressure and sneezing.   Eyes: Positive for discharge ( watery).  Respiratory: Positive for cough.   Gastrointestinal: Negative for nausea and vomiting.   Objective:  Physical Exam  Constitutional: He is oriented to person, place, and time. He appears well-developed and well-nourished.  HENT:  Head:  Normocephalic and atraumatic.  Right Ear: Tympanic membrane, external ear and ear canal normal.  Left Ear: Tympanic membrane, external ear and ear canal normal.  Nose: No rhinorrhea. Right sinus exhibits maxillary sinus tenderness ( slight tenderness).  Mouth/Throat: Oropharynx is clear and moist and mucous membranes are normal. No oropharyngeal exudate or posterior oropharyngeal erythema.  Eyes: Conjunctivae are normal. Pupils are equal, round, and reactive to light.  Neck: Neck supple.  Cardiovascular: Normal rate, regular rhythm, normal heart sounds and intact distal pulses.    No murmur heard. Pulmonary/Chest: Effort normal and breath sounds normal. He has no wheezes. He has no rhonchi. He has no rales.  Abdominal: Soft. There is no tenderness.  Lymphadenopathy:    He has no cervical adenopathy.  Neurological: He is alert and oriented to person, place, and time.  Skin: Skin is warm and dry. No rash noted.  Psychiatric: He has a normal mood and affect. His behavior is normal.  Vitals reviewed.  Filed Vitals:   06/10/15 1114  BP: 126/74  Pulse: 72  Temp: 98.2 F (36.8 C)  TempSrc: Oral  Resp: 17  Height: 5' 9.5" (1.765 m)  Weight: 193 lb (87.544 kg)  SpO2: 98%   Assessment & Plan:   Colton Suarez is a 60 y.o. male Sinobronchitis - Plan: levofloxacin (LEVAQUIN) 750 MG tablet  Acute upper respiratory infection - Plan: levofloxacin (LEVAQUIN) 750 MG tablet  Possible secondary viral syndrome, versus less likely persistent sinus infection that was resistant to azithromycin. Discussed with lack of discolored nasal discharge, more likely viral infection, and risks were discussed with repeat antibiotics including but not limited to diarrhea, C. difficile infection, and discussed tendon inflammation or rupture with quinolone antibiotics.   -Discussed saline nasal spray and Mucinex DM for current symptoms, then if not improving the next few days, can start Levaquin with risks described as above. RTC precautions   Levaquin reprinted as not seen on printer.   Meds ordered this encounter  Medications  . levofloxacin (LEVAQUIN) 750 MG tablet    Sig: Take 1 tablet (750 mg total) by mouth daily.    Dispense:  5 tablet    Refill:  0   Patient Instructions  Although it is possible that you have a sinus infection is resistant or recurrent, more likely you have picked up another viral infection. Solar nasal spray for 5 times per day, Mucinex or Mucinex DM as needed for cough, then if not improving the next few days, did prescribe the Levaquin to take for sinus  infection.(1 pill for 5 days). If this medicine is too expensive when you get the pharmacy, call me as we have other options.  Return to the clinic or go to the nearest emergency room if any of your symptoms worsen or new symptoms occur.  Sinusitis, Adult Sinusitis is redness, soreness, and inflammation of the paranasal sinuses. Paranasal sinuses are air pockets within the bones of your face. They are located beneath your eyes, in the middle of your forehead, and above your eyes. In healthy paranasal sinuses, mucus is able to drain out, and air is able to circulate through them by way of your nose. However, when your paranasal sinuses are inflamed, mucus and air can become trapped. This can allow bacteria and other germs to grow and cause infection. Sinusitis can develop quickly and last only a short time (acute) or continue over a long period (chronic). Sinusitis that lasts for more than 12 weeks is considered chronic. CAUSES Causes of  sinusitis include:  Allergies.  Structural abnormalities, such as displacement of the cartilage that separates your nostrils (deviated septum), which can decrease the air flow through your nose and sinuses and affect sinus drainage.  Functional abnormalities, such as when the small hairs (cilia) that line your sinuses and help remove mucus do not work properly or are not present. SIGNS AND SYMPTOMS Symptoms of acute and chronic sinusitis are the same. The primary symptoms are pain and pressure around the affected sinuses. Other symptoms include:  Upper toothache.  Earache.  Headache.  Bad breath.  Decreased sense of smell and taste.  A cough, which worsens when you are lying flat.  Fatigue.  Fever.  Thick drainage from your nose, which often is green and may contain pus (purulent).  Swelling and warmth over the affected sinuses. DIAGNOSIS Your health care provider will perform a physical exam. During your exam, your health care provider may  perform any of the following to help determine if you have acute sinusitis or chronic sinusitis:  Look in your nose for signs of abnormal growths in your nostrils (nasal polyps).  Tap over the affected sinus to check for signs of infection.  View the inside of your sinuses using an imaging device that has a light attached (endoscope). If your health care provider suspects that you have chronic sinusitis, one or more of the following tests may be recommended:  Allergy tests.  Nasal culture. A sample of mucus is taken from your nose, sent to a lab, and screened for bacteria.  Nasal cytology. A sample of mucus is taken from your nose and examined by your health care provider to determine if your sinusitis is related to an allergy. TREATMENT Most cases of acute sinusitis are related to a viral infection and will resolve on their own within 10 days. Sometimes, medicines are prescribed to help relieve symptoms of both acute and chronic sinusitis. These may include pain medicines, decongestants, nasal steroid sprays, or saline sprays. However, for sinusitis related to a bacterial infection, your health care provider will prescribe antibiotic medicines. These are medicines that will help kill the bacteria causing the infection. Rarely, sinusitis is caused by a fungal infection. In these cases, your health care provider will prescribe antifungal medicine. For some cases of chronic sinusitis, surgery is needed. Generally, these are cases in which sinusitis recurs more than 3 times per year, despite other treatments. HOME CARE INSTRUCTIONS  Drink plenty of water. Water helps thin the mucus so your sinuses can drain more easily.  Use a humidifier.  Inhale steam 3-4 times a day (for example, sit in the bathroom with the shower running).  Apply a warm, moist washcloth to your face 3-4 times a day, or as directed by your health care provider.  Use saline nasal sprays to help moisten and clean your  sinuses.  Take medicines only as directed by your health care provider.  If you were prescribed either an antibiotic or antifungal medicine, finish it all even if you start to feel better. SEEK IMMEDIATE MEDICAL CARE IF:  You have increasing pain or severe headaches.  You have nausea, vomiting, or drowsiness.  You have swelling around your face.  You have vision problems.  You have a stiff neck.  You have difficulty breathing.   This information is not intended to replace advice given to you by your health care provider. Make sure you discuss any questions you have with your health care provider.   Document Released: 05/07/2005 Document Revised:  05/28/2014 Document Reviewed: 05/22/2011 Elsevier Interactive Patient Education 2016 [employer]   Upper Respiratory Infection, Adult Most upper respiratory infections (URIs) are a viral infection of the air passages leading to the lungs. A URI affects the nose, throat, and upper air passages. The most common type of URI is nasopharyngitis and is typically referred to as "the common cold." URIs run their course and usually go away on their own. Most of the time, a URI does not require medical attention, but sometimes a bacterial infection in the upper airways can follow a viral infection. This is called a secondary infection. Sinus and middle ear infections are common types of secondary upper respiratory infections. Bacterial pneumonia can also complicate a URI. A URI can worsen asthma and chronic obstructive pulmonary disease (COPD). Sometimes, these complications can require emergency medical care and may be life threatening.  CAUSES Almost all URIs are caused by viruses. A virus is a type of germ and can spread from one person to another.  RISKS FACTORS You may be at risk for a URI if:   You smoke.   You have chronic heart or lung disease.  You have a weakened defense (immune) system.   You are very young or very old.   You  have nasal allergies or asthma.  You work in crowded or poorly ventilated areas.  You work in health care facilities or schools. SIGNS AND SYMPTOMS  Symptoms typically develop 2-3 days after you come in contact with a cold virus. Most viral URIs last 7-10 days. However, viral URIs from the influenza virus (flu virus) can last 14-18 days and are typically more severe. Symptoms may include:   Runny or stuffy (congested) nose.   Sneezing.   Cough.   Sore throat.   Headache.   Fatigue.   Fever.   Loss of appetite.   Pain in your forehead, behind your eyes, and over your cheekbones (sinus pain).  Muscle aches.  DIAGNOSIS  Your health care provider may diagnose a URI by:  Physical exam.  Tests to check that your symptoms are not due to another condition such as:  Strep throat.  Sinusitis.  Pneumonia.  Asthma. TREATMENT  A URI goes away on its own with time. It cannot be cured with medicines, but medicines may be prescribed or recommended to relieve symptoms. Medicines may help:  Reduce your fever.  Reduce your cough.  Relieve nasal congestion. HOME CARE INSTRUCTIONS   Take medicines only as directed by your health care provider.   Gargle warm saltwater or take cough drops to comfort your throat as directed by your health care provider.  Use a warm mist humidifier or inhale steam from a shower to increase air moisture. This may make it easier to breathe.  Drink enough fluid to keep your urine clear or pale yellow.   Eat soups and other clear broths and maintain good nutrition.   Rest as needed.   Return to work when your temperature has returned to normal or as your health care provider advises. You may need to stay home longer to avoid infecting others. You can also use a face mask and careful hand washing to prevent spread of the virus.  Increase the usage of your inhaler if you have asthma.   Do not use any tobacco products, including  cigarettes, chewing tobacco, or electronic cigarettes. If you need help quitting, ask your health care provider. PREVENTION  The best way to protect yourself from getting a cold is to  practice good hygiene.   Avoid oral or hand contact with people with cold symptoms.   Wash your hands often if contact occurs.  There is no clear evidence that vitamin C, vitamin E, echinacea, or exercise reduces the chance of developing a cold. However, it is always recommended to get plenty of rest, exercise, and practice good nutrition.  SEEK MEDICAL CARE IF:   You are getting worse rather than better.   Your symptoms are not controlled by medicine.   You have chills.  You have worsening shortness of breath.  You have brown or red mucus.  You have yellow or brown nasal discharge.  You have pain in your face, especially when you bend forward.  You have a fever.  You have swollen neck glands.  You have pain while swallowing.  You have white areas in the back of your throat. SEEK IMMEDIATE MEDICAL CARE IF:   You have severe or persistent:  Headache.  Ear pain.  Sinus pain.  Chest pain.  You have chronic lung disease and any of the following:  Wheezing.  Prolonged cough.  Coughing up blood.  A change in your usual mucus.  You have a stiff neck.  You have changes in your:  Vision.  Hearing.  Thinking.  Mood. MAKE SURE YOU:   Understand these instructions.  Will watch your condition.  Will get help right away if you are not doing well or get worse.   This information is not intended to replace advice given to you by your health care provider. Make sure you discuss any questions you have with your health care provider.   Document Released: 10/31/2000 Document Revised: 09/21/2014 Document Reviewed: 08/12/2013 Elsevier Interactive Patient Education [employer]     I personally performed the services described in this documentation, which was scribed  in my presence. The recorded information has been reviewed and considered, and addended by me as needed.

## 2015-06-10 NOTE — Patient Instructions (Signed)
Although it is possible that you have a sinus infection is resistant or recurrent, more likely you have picked up another viral infection. Solar nasal spray for 5 times per day, Mucinex or Mucinex DM as needed for cough, then if not improving the next few days, did prescribe the Levaquin to take for sinus infection.(1 pill for 5 days). If this medicine is too expensive when you get the pharmacy, call me as we have other options.  Return to the clinic or go to the nearest emergency room if any of your symptoms worsen or new symptoms occur.  Sinusitis, Adult Sinusitis is redness, soreness, and inflammation of the paranasal sinuses. Paranasal sinuses are air pockets within the bones of your face. They are located beneath your eyes, in the middle of your forehead, and above your eyes. In healthy paranasal sinuses, mucus is able to drain out, and air is able to circulate through them by way of your nose. However, when your paranasal sinuses are inflamed, mucus and air can become trapped. This can allow bacteria and other germs to grow and cause infection. Sinusitis can develop quickly and last only a short time (acute) or continue over a long period (chronic). Sinusitis that lasts for more than 12 weeks is considered chronic. CAUSES Causes of sinusitis include:  Allergies.  Structural abnormalities, such as displacement of the cartilage that separates your nostrils (deviated septum), which can decrease the air flow through your nose and sinuses and affect sinus drainage.  Functional abnormalities, such as when the small hairs (cilia) that line your sinuses and help remove mucus do not work properly or are not present. SIGNS AND SYMPTOMS Symptoms of acute and chronic sinusitis are the same. The primary symptoms are pain and pressure around the affected sinuses. Other symptoms include:  Upper toothache.  Earache.  Headache.  Bad breath.  Decreased sense of smell and taste.  A cough, which  worsens when you are lying flat.  Fatigue.  Fever.  Thick drainage from your nose, which often is green and may contain pus (purulent).  Swelling and warmth over the affected sinuses. DIAGNOSIS Your health care provider will perform a physical exam. During your exam, your health care provider may perform any of the following to help determine if you have acute sinusitis or chronic sinusitis:  Look in your nose for signs of abnormal growths in your nostrils (nasal polyps).  Tap over the affected sinus to check for signs of infection.  View the inside of your sinuses using an imaging device that has a light attached (endoscope). If your health care provider suspects that you have chronic sinusitis, one or more of the following tests may be recommended:  Allergy tests.  Nasal culture. A sample of mucus is taken from your nose, sent to a lab, and screened for bacteria.  Nasal cytology. A sample of mucus is taken from your nose and examined by your health care provider to determine if your sinusitis is related to an allergy. TREATMENT Most cases of acute sinusitis are related to a viral infection and will resolve on their own within 10 days. Sometimes, medicines are prescribed to help relieve symptoms of both acute and chronic sinusitis. These may include pain medicines, decongestants, nasal steroid sprays, or saline sprays. However, for sinusitis related to a bacterial infection, your health care provider will prescribe antibiotic medicines. These are medicines that will help kill the bacteria causing the infection. Rarely, sinusitis is caused by a fungal infection. In these cases, your health  care provider will prescribe antifungal medicine. For some cases of chronic sinusitis, surgery is needed. Generally, these are cases in which sinusitis recurs more than 3 times per year, despite other treatments. HOME CARE INSTRUCTIONS  Drink plenty of water. Water helps thin the mucus so your  sinuses can drain more easily.  Use a humidifier.  Inhale steam 3-4 times a day (for example, sit in the bathroom with the shower running).  Apply a warm, moist washcloth to your face 3-4 times a day, or as directed by your health care provider.  Use saline nasal sprays to help moisten and clean your sinuses.  Take medicines only as directed by your health care provider.  If you were prescribed either an antibiotic or antifungal medicine, finish it all even if you start to feel better. SEEK IMMEDIATE MEDICAL CARE IF:  You have increasing pain or severe headaches.  You have nausea, vomiting, or drowsiness.  You have swelling around your face.  You have vision problems.  You have a stiff neck.  You have difficulty breathing.   This information is not intended to replace advice given to you by your health care provider. Make sure you discuss any questions you have with your health care provider.   Document Released: 05/07/2005 Document Revised: 05/28/2014 Document Reviewed: 05/22/2011 Elsevier Interactive Patient Education 2016 [employer]   Upper Respiratory Infection, Adult Most upper respiratory infections (URIs) are a viral infection of the air passages leading to the lungs. A URI affects the nose, throat, and upper air passages. The most common type of URI is nasopharyngitis and is typically referred to as "the common cold." URIs run their course and usually go away on their own. Most of the time, a URI does not require medical attention, but sometimes a bacterial infection in the upper airways can follow a viral infection. This is called a secondary infection. Sinus and middle ear infections are common types of secondary upper respiratory infections. Bacterial pneumonia can also complicate a URI. A URI can worsen asthma and chronic obstructive pulmonary disease (COPD). Sometimes, these complications can require emergency medical care and may be life threatening.   CAUSES Almost all URIs are caused by viruses. A virus is a type of germ and can spread from one person to another.  RISKS FACTORS You may be at risk for a URI if:   You smoke.   You have chronic heart or lung disease.  You have a weakened defense (immune) system.   You are very young or very old.   You have nasal allergies or asthma.  You work in crowded or poorly ventilated areas.  You work in health care facilities or schools. SIGNS AND SYMPTOMS  Symptoms typically develop 2-3 days after you come in contact with a cold virus. Most viral URIs last 7-10 days. However, viral URIs from the influenza virus (flu virus) can last 14-18 days and are typically more severe. Symptoms may include:   Runny or stuffy (congested) nose.   Sneezing.   Cough.   Sore throat.   Headache.   Fatigue.   Fever.   Loss of appetite.   Pain in your forehead, behind your eyes, and over your cheekbones (sinus pain).  Muscle aches.  DIAGNOSIS  Your health care provider may diagnose a URI by:  Physical exam.  Tests to check that your symptoms are not due to another condition such as:  Strep throat.  Sinusitis.  Pneumonia.  Asthma. TREATMENT  A URI goes away on  its own with time. It cannot be cured with medicines, but medicines may be prescribed or recommended to relieve symptoms. Medicines may help:  Reduce your fever.  Reduce your cough.  Relieve nasal congestion. HOME CARE INSTRUCTIONS   Take medicines only as directed by your health care provider.   Gargle warm saltwater or take cough drops to comfort your throat as directed by your health care provider.  Use a warm mist humidifier or inhale steam from a shower to increase air moisture. This may make it easier to breathe.  Drink enough fluid to keep your urine clear or pale yellow.   Eat soups and other clear broths and maintain good nutrition.   Rest as needed.   Return to work when your temperature  has returned to normal or as your health care provider advises. You may need to stay home longer to avoid infecting others. You can also use a face mask and careful hand washing to prevent spread of the virus.  Increase the usage of your inhaler if you have asthma.   Do not use any tobacco products, including cigarettes, chewing tobacco, or electronic cigarettes. If you need help quitting, ask your health care provider. PREVENTION  The best way to protect yourself from getting a cold is to practice good hygiene.   Avoid oral or hand contact with people with cold symptoms.   Wash your hands often if contact occurs.  There is no clear evidence that vitamin C, vitamin E, echinacea, or exercise reduces the chance of developing a cold. However, it is always recommended to get plenty of rest, exercise, and practice good nutrition.  SEEK MEDICAL CARE IF:   You are getting worse rather than better.   Your symptoms are not controlled by medicine.   You have chills.  You have worsening shortness of breath.  You have brown or red mucus.  You have yellow or brown nasal discharge.  You have pain in your face, especially when you bend forward.  You have a fever.  You have swollen neck glands.  You have pain while swallowing.  You have white areas in the back of your throat. SEEK IMMEDIATE MEDICAL CARE IF:   You have severe or persistent:  Headache.  Ear pain.  Sinus pain.  Chest pain.  You have chronic lung disease and any of the following:  Wheezing.  Prolonged cough.  Coughing up blood.  A change in your usual mucus.  You have a stiff neck.  You have changes in your:  Vision.  Hearing.  Thinking.  Mood. MAKE SURE YOU:   Understand these instructions.  Will watch your condition.  Will get help right away if you are not doing well or get worse.   This information is not intended to replace advice given to you by your health care provider. Make sure  you discuss any questions you have with your health care provider.   Document Released: 10/31/2000 Document Revised: 09/21/2014 Document Reviewed: 08/12/2013 Elsevier Interactive Patient Education [employer]

## 2016-08-07 ENCOUNTER — Ambulatory Visit (INDEPENDENT_AMBULATORY_CARE_PROVIDER_SITE_OTHER): Payer: Self-pay | Admitting: Physician Assistant

## 2016-08-07 VITALS — BP 110/60 | HR 65 | Temp 97.5°F | Resp 16 | Ht 70.0 in | Wt 196.0 lb

## 2016-08-07 DIAGNOSIS — Z0289 Encounter for other administrative examinations: Secondary | ICD-10-CM

## 2016-08-07 NOTE — Patient Instructions (Signed)
     IF you received an x-ray today, you will receive an invoice from [facility]. Please contact [facility] at [phone] with questions or concerns regarding your invoice.   IF you received labwork today, you will receive an invoice from [facility]. Please contact [facility] at [phone] with questions or concerns regarding your invoice.   Our billing staff will not be able to assist you with questions regarding bills from these companies.  You will be contacted with the lab results as soon as they are available. The fastest way to get your results is to activate your My Chart account. Instructions are located on the last page of this paperwork. If you have not heard from us regarding the results in 2 weeks, please contact this office.     

## 2016-08-10 ENCOUNTER — Telehealth: Payer: Self-pay | Admitting: Physician Assistant

## 2016-08-10 DIAGNOSIS — L989 Disorder of the skin and subcutaneous tissue, unspecified: Secondary | ICD-10-CM

## 2016-08-10 NOTE — Telephone Encounter (Signed)
Placing referral for raised nodule at the left side of neck along the mandible, and inferior to the left ear. .3cm nodule without erythema and small punctate.  This is overlying pulsating vessel.  Patient is requesting removal.   He would like a dermatology consult, and I am placing this today

## 2016-08-10 NOTE — Progress Notes (Signed)
PRIMARY C[facility] AT [facility] [address], [place] [phone]  Date:  08/07/2016   Name:  Shanon Browerry L Stiefel   DOB:  1955/07/20   MRN:  [id number]  PCP:  No primary care provider on file.    History of Present Illness:  Shanon Browerry L Maestas is a 61 y.o. male patient who presents to PCP with  Chief Complaint  Patient presents with  . DOT PE     No concerns or complaints at this time. No chronic medications or medical problem.  Patient Active Problem List   Diagnosis Date Noted  . Allergic rhinitis 02/24/2014    Past Medical History:  Diagnosis Date  . Allergy     Past Surgical History:  Procedure Laterality Date  . THROAT SURGERY     tosilectomy  . TONSILLECTOMY AND ADENOIDECTOMY     at 61yo    Social History  Substance Use Topics  . Smoking status: Never Smoker  . Smokeless tobacco: Never Used  . Alcohol use Yes     Comment: social    Family History  Problem Relation Age of Onset  . Hypertension Father     Allergies  Allergen Reactions  . Other     STEROIDS: Does not know name, however the reaction lead to pancreatitis.     Medication list has been reviewed and updated.  Current Outpatient Prescriptions on File Prior to Visit  Medication Sig Dispense Refill  . cetirizine (ZYRTEC) 10 MG tablet Take 10 mg by mouth daily.    . fluticasone (FLONASE) 50 MCG/ACT nasal spray Place 2 sprays into both nostrils daily. 16 g 12  . HYDROcodone-acetaminophen (HYCET) 7.5-325 mg/15 ml solution Take 5 mLs by mouth every 6 (six) hours as needed (or cough). (Patient not taking: Reported on 08/07/2016) 240 mL 0  . levofloxacin (LEVAQUIN) 750 MG tablet Take 1 tablet (750 mg total) by mouth daily. (Patient not taking: Reported on 08/07/2016) 5 tablet 0   No current facility-administered medications on file prior to visit.     Review of Systems  Constitutional: Negative for chills and fever.  HENT: Negative for ear discharge, ear pain and sore throat.   Eyes: Negative  for blurred vision and double vision.  Respiratory: Negative for cough, shortness of breath and wheezing.   Cardiovascular: Negative for chest pain, palpitations and leg swelling.  Gastrointestinal: Negative for diarrhea, nausea and vomiting.  Genitourinary: Negative for dysuria, frequency and hematuria.  Skin: Negative for itching and rash.  Neurological: Negative for dizziness and headaches.   ROS otherwise unremarkable unless listed above.  Physical Examination: BP 110/60   Pulse 65   Temp 97.5 F (36.4 C) (Oral)   Resp 16   Ht 5\' 10"  (1.778 m)   Wt 196 lb (88.9 kg)   SpO2 99%   BMI 28.12 kg/m  Ideal Body Weight: Weight in (lb) to have BMI = 25: 173.9  Physical Exam  Constitutional: He is oriented to person, place, and time. He appears well-developed and well-nourished. No distress.  HENT:  Head: Normocephalic and atraumatic.  Right Ear: Tympanic membrane, external ear and ear canal normal.  Left Ear: Tympanic membrane, external ear and ear canal normal.  Eyes: Conjunctivae and EOM are normal. Pupils are equal, round, and reactive to light.  Cardiovascular: Normal rate and regular rhythm.  Exam reveals no friction rub.   No murmur heard. Pulmonary/Chest: Effort normal. No respiratory distress. He has no wheezes.  Abdominal: Soft. Bowel sounds are normal. He exhibits  no distension and no mass. There is no tenderness.  Musculoskeletal: Normal range of motion. He exhibits no edema or tenderness.  Neurological: He is alert and oriented to person, place, and time. He displays normal reflexes.  Skin: Skin is warm and dry. He is not diaphoretic.  Psychiatric: He has a normal mood and affect. His behavior is normal.    Visual Acuity Screening   Right eye Left eye Both eyes  Without correction:     With correction: 20/25 20/25 20/20  -1  Comments: Titmus 85%\ Colors 6/6  Hearing Screening Comments: Whisper 9 ft   Assessment and Plan: JAHIR HALT is a 61 y.o. male who is  here today for cc of dot exam.  2 year given.  Encounter for examination required by Department of Transportation (DOT)  Trena Platt, PA-C [facility] 3/23/20187:55 AM

## 2016-10-29 ENCOUNTER — Ambulatory Visit (INDEPENDENT_AMBULATORY_CARE_PROVIDER_SITE_OTHER): Payer: Managed Care, Other (non HMO) | Admitting: Family Medicine

## 2016-10-29 ENCOUNTER — Encounter: Payer: Self-pay | Admitting: Family Medicine

## 2016-10-29 VITALS — BP 124/67 | HR 74 | Temp 98.0°F | Resp 16 | Ht 70.0 in | Wt 195.6 lb

## 2016-10-29 DIAGNOSIS — J22 Unspecified acute lower respiratory infection: Secondary | ICD-10-CM | POA: Diagnosis not present

## 2016-10-29 DIAGNOSIS — R05 Cough: Secondary | ICD-10-CM

## 2016-10-29 DIAGNOSIS — R059 Cough, unspecified: Secondary | ICD-10-CM

## 2016-10-29 MED ORDER — AZITHROMYCIN 250 MG PO TABS: ORAL_TABLET | ORAL | 0 refills | Status: DC

## 2016-10-29 NOTE — Progress Notes (Signed)
Subjective:  By signing my name below, I, Stann Ore, attest that this documentation has been prepared under the direction and in the presence of Meredith Staggers, MD. Electronically Signed: Stann Ore, Scribe. 10/29/2016 , 5:06 PM .  Patient was seen in Room 11 .   Patient ID: Colton Suarez, male    DOB: 10-30-1955, 61 y.o.   MRN: [id number] Chief Complaint  Patient presents with  . URI    low grade fever started a week ago, hacking cough, green mucus, taking robitussin    HPI Colton Suarez is a 61 y.o. male Patient states his illness started a week ago with sinus congestion. He was initially blowing out clear mucus but became greenish color about 4-5 days ago. He felt warm for a few days with a low grade fever, but did not measure his temperature. He's been having dry hacking cough and some loss of voice as well, coughing up more mucus than he's been blowing out of his nose. He also mentions initially having some teeth pain and ear pain, but these have improved. He had sick contact when visiting family in [place] a week ago: his grandson and his son-in-law had similar symptoms. He's been taking Alka-Seltzer severe cold tablets and Robitussin [facility] brand.   Patient Active Problem List   Diagnosis Date Noted  . Allergic rhinitis 02/24/2014   Past Medical History:  Diagnosis Date  . Allergy    Past Surgical History:  Procedure Laterality Date  . THROAT SURGERY     tosilectomy  . TONSILLECTOMY AND ADENOIDECTOMY     at 61yo   Allergies  Allergen Reactions  . Other     STEROIDS: Does not know name, however the reaction lead to pancreatitis.    Prior to Admission medications   Medication Sig Start Date End Date Taking? Authorizing Provider  cetirizine (ZYRTEC) 10 MG tablet Take 10 mg by mouth daily.    [provider]  fluticasone (FLONASE) 50 MCG/ACT nasal spray Place 2 sprays into both nostrils daily. 02/23/14   Ofilia Neas, PA-C  HYDROcodone-acetaminophen  (HYCET) 7.5-325 mg/15 ml solution Take 5 mLs by mouth every 6 (six) hours as needed (or cough). Patient not taking: Reported on 08/07/2016 01/17/15   Jonita Albee, MD  levofloxacin (LEVAQUIN) 750 MG tablet Take 1 tablet (750 mg total) by mouth daily. Patient not taking: Reported on 08/07/2016 06/10/15   Shade Flood, MD   Social History   Social History  . Marital status: Single    Spouse name: N/A  . Number of children: N/A  . Years of education: N/A   Occupational History  . Not on file.   Social History Main Topics  . Smoking status: Never Smoker  . Smokeless tobacco: Never Used  . Alcohol use Yes     Comment: social  . Drug use: No  . Sexual activity: Not on file   Other Topics Concern  . Not on file   Social History Narrative  . No narrative on file   Review of Systems  Constitutional: Positive for fatigue and fever. Negative for chills and diaphoresis.  HENT: Positive for congestion, rhinorrhea, sinus pressure and voice change. Negative for dental problem and ear pain.   Respiratory: Positive for cough. Negative for shortness of breath and wheezing.        Objective:   Physical Exam  Constitutional: He is oriented to person, place, and time. He appears well-developed and well-nourished.  HENT:  Head: Normocephalic  and atraumatic.  Right Ear: Tympanic membrane, external ear and ear canal normal.  Left Ear: Tympanic membrane, external ear and ear canal normal.  Nose: No rhinorrhea. Right sinus exhibits no maxillary sinus tenderness. Left sinus exhibits no maxillary sinus tenderness.  Mouth/Throat: Oropharynx is clear and moist and mucous membranes are normal. No oropharyngeal exudate or posterior oropharyngeal erythema.  Eyes: Conjunctivae are normal. Pupils are equal, round, and reactive to light.  Neck: Neck supple.  Cardiovascular: Normal rate, regular rhythm, normal heart sounds and intact distal pulses.   No murmur heard. Pulmonary/Chest: Effort normal  and breath sounds normal. He has no wheezes. He has no rhonchi. He has no rales.  Abdominal: Soft. There is no tenderness.  Lymphadenopathy:    He has no cervical adenopathy.  Neurological: He is alert and oriented to person, place, and time.  Skin: Skin is warm and dry. No rash noted.  Psychiatric: He has a normal mood and affect. His behavior is normal.  Vitals reviewed.   Vitals:   10/29/16 1615  BP: 124/67  Pulse: 74  Resp: 16  Temp: 98 F (36.7 C)  TempSrc: Oral  SpO2: 97%  Weight: 195 lb 9.6 oz (88.7 kg)  Height: 5\' 10"  (1.778 m)      Assessment & Plan:    [name] is a 61 y.o. male LRTI (lower respiratory tract infection) - Plan: azithromycin (ZITHROMAX) 250 MG tablet  Cough - Plan: azithromycin (ZITHROMAX) 250 MG tablet  Possible initial viral illness with similar symptoms and sick contacts. Reassuring exam. Progression towards chest symptoms, early lower respiratory infection/bronchitis.  -Continue Mucinex, saline nasal spray, symptomatic care discussed  -Printed prescription for Z-Pak if not improving in the next few days. RTC precautions discussed.  Meds ordered this encounter  Medications  . azithromycin (ZITHROMAX) 250 MG tablet    Sig: Take 2 pills by mouth on day 1, then 1 pill by mouth per day on days 2 through 5.    Dispense:  6 tablet    Refill:  0   Patient Instructions   Your cough and congestion could still be due to a virus, and those tend to improve within a week to 10 days. If you have continued cough for chest symptoms that are not improving the next few days, can start the antibiotic that was printed today. Mucinex or Mucinex DM as needed for cough, saline/salt water nasal spray for sinus and nasal congestion.   Upper Respiratory Infection, Adult Most upper respiratory infections (URIs) are a viral infection of the air passages leading to the lungs. A URI affects the nose, throat, and upper air passages. The most common type of URI is  nasopharyngitis and is typically referred to as "the common cold." URIs run their course and usually go away on their own. Most of the time, a URI does not require medical attention, but sometimes a bacterial infection in the upper airways can follow a viral infection. This is called a secondary infection. Sinus and middle ear infections are common types of secondary upper respiratory infections. Bacterial pneumonia can also complicate a URI. A URI can worsen asthma and chronic obstructive pulmonary disease (COPD). Sometimes, these complications can require emergency medical care and may be life threatening. What are the causes? Almost all URIs are caused by viruses. A virus is a type of germ and can spread from one person to another. What increases the risk? You may be at risk for a URI if:  You smoke.  You  have chronic heart or lung disease.  You have a weakened defense (immune) system.  You are very young or very old.  You have nasal allergies or asthma.  You work in crowded or poorly ventilated areas.  You work in health care facilities or schools.  What are the signs or symptoms? Symptoms typically develop 2-3 days after you come in contact with a cold virus. Most viral URIs last 7-10 days. However, viral URIs from the influenza virus (flu virus) can last 14-18 days and are typically more severe. Symptoms may include:  Runny or stuffy (congested) nose.  Sneezing.  Cough.  Sore throat.  Headache.  Fatigue.  Fever.  Loss of appetite.  Pain in your forehead, behind your eyes, and over your cheekbones (sinus pain).  Muscle aches.  How is this diagnosed? Your health care provider may diagnose a URI by:  Physical exam.  Tests to check that your symptoms are not due to another condition such as: ? Strep throat. ? Sinusitis. ? Pneumonia. ? Asthma.  How is this treated? A URI goes away on its own with time. It cannot be cured with medicines, but medicines may be  prescribed or recommended to relieve symptoms. Medicines may help:  Reduce your fever.  Reduce your cough.  Relieve nasal congestion.  Follow these instructions at home:  Take medicines only as directed by your health care provider.  Gargle warm saltwater or take cough drops to comfort your throat as directed by your health care provider.  Use a warm mist humidifier or inhale steam from a shower to increase air moisture. This may make it easier to breathe.  Drink enough fluid to keep your urine clear or pale yellow.  Eat soups and other clear broths and maintain good nutrition.  Rest as needed.  Return to work when your temperature has returned to normal or as your health care provider advises. You may need to stay home longer to avoid infecting others. You can also use a face mask and careful hand washing to prevent spread of the virus.  Increase the usage of your inhaler if you have asthma.  Do not use any tobacco products, including cigarettes, chewing tobacco, or electronic cigarettes. If you need help quitting, ask your health care provider. How is this prevented? The best way to protect yourself from getting a cold is to practice good hygiene.  Avoid oral or hand contact with people with cold symptoms.  Wash your hands often if contact occurs.  There is no clear evidence that vitamin C, vitamin E, echinacea, or exercise reduces the chance of developing a cold. However, it is always recommended to get plenty of rest, exercise, and practice good nutrition. Contact a health care provider if:  You are getting worse rather than better.  Your symptoms are not controlled by medicine.  You have chills.  You have worsening shortness of breath.  You have brown or red mucus.  You have yellow or brown nasal discharge.  You have pain in your face, especially when you bend forward.  You have a fever.  You have swollen neck glands.  You have pain while swallowing.  You  have white areas in the back of your throat. Get help right away if:  You have severe or persistent: ? Headache. ? Ear pain. ? Sinus pain. ? Chest pain.  You have chronic lung disease and any of the following: ? Wheezing. ? Prolonged cough. ? Coughing up blood. ? A change in your usual  mucus.  You have a stiff neck.  You have changes in your: ? Vision. ? Hearing. ? Thinking. ? Mood. This information is not intended to replace advice given to you by your health care provider. Make sure you discuss any questions you have with your health care provider. Document Released: 10/31/2000 Document Revised: 01/08/2016 Document Reviewed: 08/12/2013 Elsevier Interactive Patient Education  2017 [employer]    Acute Bronchitis, Adult Acute bronchitis is sudden (acute) swelling of the air tubes (bronchi) in the lungs. Acute bronchitis causes these tubes to fill with mucus, which can make it hard to breathe. It can also cause coughing or wheezing. In adults, acute bronchitis usually goes away within 2 weeks. A cough caused by bronchitis may last up to 3 weeks. Smoking, allergies, and asthma can make the condition worse. Repeated episodes of bronchitis may cause further lung problems, such as chronic obstructive pulmonary disease (COPD). What are the causes? This condition can be caused by germs and by substances that irritate the lungs, including:  Cold and flu viruses. This condition is most often caused by the same virus that causes a cold.  Bacteria.  Exposure to tobacco smoke, dust, fumes, and air pollution.  What increases the risk? This condition is more likely to develop in people who:  Have close contact with someone with acute bronchitis.  Are exposed to lung irritants, such as tobacco smoke, dust, fumes, and vapors.  Have a weak immune system.  Have a respiratory condition such as asthma.  What are the signs or symptoms? Symptoms of this condition include:  A  cough.  Coughing up clear, yellow, or green mucus.  Wheezing.  Chest congestion.  Shortness of breath.  A fever.  Body aches.  Chills.  A sore throat.  How is this diagnosed? This condition is usually diagnosed with a physical exam. During the exam, your health care provider may order tests, such as chest X-rays, to rule out other conditions. He or she may also:  Test a sample of your mucus for bacterial infection.  Check the level of oxygen in your blood. This is done to check for pneumonia.  Do a chest X-ray or lung function testing to rule out pneumonia and other conditions.  Perform blood tests.  Your health care provider will also ask about your symptoms and medical history. How is this treated? Most cases of acute bronchitis clear up over time without treatment. Your health care provider may recommend:  Drinking more fluids. Drinking more makes your mucus thinner, which may make it easier to breathe.  Taking a medicine for a fever or cough.  Taking an antibiotic medicine.  Using an inhaler to help improve shortness of breath and to control a cough.  Using a cool mist vaporizer or humidifier to make it easier to breathe.  Follow these instructions at home: Medicines  Take over-the-counter and prescription medicines only as told by your health care provider.  If you were prescribed an antibiotic, take it as told by your health care provider. Do not stop taking the antibiotic even if you start to feel better. General instructions  Get plenty of rest.  Drink enough fluids to keep your urine clear or pale yellow.  Avoid smoking and secondhand smoke. Exposure to cigarette smoke or irritating chemicals will make bronchitis worse. If you smoke and you need help quitting, ask your health care provider. Quitting smoking will help your lungs heal faster.  Use an inhaler, cool mist vaporizer, or humidifier as told  by your health care provider.  Keep all follow-up  visits as told by your health care provider. This is important. How is this prevented? To lower your risk of getting this condition again:  Wash your hands often with soap and water. If soap and water are not available, use hand sanitizer.  Avoid contact with people who have cold symptoms.  Try not to touch your hands to your mouth, nose, or eyes.  Make sure to get the flu shot every year.  Contact a health care provider if:  Your symptoms do not improve in 2 weeks of treatment. Get help right away if:  You cough up blood.  You have chest pain.  You have severe shortness of breath.  You become dehydrated.  You faint or keep feeling like you are going to faint.  You keep vomiting.  You have a severe headache.  Your fever or chills gets worse. This information is not intended to replace advice given to you by your health care provider. Make sure you discuss any questions you have with your health care provider. Document Released: 06/14/2004 Document Revised: 11/30/2015 Document Reviewed: 10/26/2015 Elsevier Interactive Patient Education  2017 [employer]    IF you received an x-ray today, you will receive an invoice from [facility]. Please contact [facility] at [phone] with questions or concerns regarding your invoice.   IF you received labwork today, you will receive an invoice from [facility]. Please contact [facility] at [phone] with questions or concerns regarding your invoice.   Our billing staff will not be able to assist you with questions regarding bills from these companies.  You will be contacted with the lab results as soon as they are available. The fastest way to get your results is to activate your My Chart account. Instructions are located on the last page of this paperwork. If you have not heard from us regarding the results in 2 weeks, please contact this office.      I personally performed the services described in this  documentation, which was scribed in my presence. The recorded information has been reviewed and considered for accuracy and completeness, addended by me as needed, and agree with information above.  Signed,   Meredith StaggersJeffrey Hailley Byers, MD [facility].  10/29/16 5:17 PM

## 2016-10-29 NOTE — Patient Instructions (Addendum)
Your cough and congestion could still be due to a virus, and those tend to improve within a week to 10 days. If you have continued cough for chest symptoms that are not improving the next few days, can start the antibiotic that was printed today. Mucinex or Mucinex DM as needed for cough, saline/salt water nasal spray for sinus and nasal congestion.   Upper Respiratory Infection, Adult Most upper respiratory infections (URIs) are a viral infection of the air passages leading to the lungs. A URI affects the nose, throat, and upper air passages. The most common type of URI is nasopharyngitis and is typically referred to as "the common cold." URIs run their course and usually go away on their own. Most of the time, a URI does not require medical attention, but sometimes a bacterial infection in the upper airways can follow a viral infection. This is called a secondary infection. Sinus and middle ear infections are common types of secondary upper respiratory infections. Bacterial pneumonia can also complicate a URI. A URI can worsen asthma and chronic obstructive pulmonary disease (COPD). Sometimes, these complications can require emergency medical care and may be life threatening. What are the causes? Almost all URIs are caused by viruses. A virus is a type of germ and can spread from one person to another. What increases the risk? You may be at risk for a URI if:  You smoke.  You have chronic heart or lung disease.  You have a weakened defense (immune) system.  You are very young or very old.  You have nasal allergies or asthma.  You work in crowded or poorly ventilated areas.  You work in health care facilities or schools.  What are the signs or symptoms? Symptoms typically develop 2-3 days after you come in contact with a cold virus. Most viral URIs last 7-10 days. However, viral URIs from the influenza virus (flu virus) can last 14-18 days and are typically more severe. Symptoms may  include:  Runny or stuffy (congested) nose.  Sneezing.  Cough.  Sore throat.  Headache.  Fatigue.  Fever.  Loss of appetite.  Pain in your forehead, behind your eyes, and over your cheekbones (sinus pain).  Muscle aches.  How is this diagnosed? Your health care provider may diagnose a URI by:  Physical exam.  Tests to check that your symptoms are not due to another condition such as: ? Strep throat. ? Sinusitis. ? Pneumonia. ? Asthma.  How is this treated? A URI goes away on its own with time. It cannot be cured with medicines, but medicines may be prescribed or recommended to relieve symptoms. Medicines may help:  Reduce your fever.  Reduce your cough.  Relieve nasal congestion.  Follow these instructions at home:  Take medicines only as directed by your health care provider.  Gargle warm saltwater or take cough drops to comfort your throat as directed by your health care provider.  Use a warm mist humidifier or inhale steam from a shower to increase air moisture. This may make it easier to breathe.  Drink enough fluid to keep your urine clear or pale yellow.  Eat soups and other clear broths and maintain good nutrition.  Rest as needed.  Return to work when your temperature has returned to normal or as your health care provider advises. You may need to stay home longer to avoid infecting others. You can also use a face mask and careful hand washing to prevent spread of the virus.  Increase the  usage of your inhaler if you have asthma.  Do not use any tobacco products, including cigarettes, chewing tobacco, or electronic cigarettes. If you need help quitting, ask your health care provider. How is this prevented? The best way to protect yourself from getting a cold is to practice good hygiene.  Avoid oral or hand contact with people with cold symptoms.  Wash your hands often if contact occurs.  There is no clear evidence that vitamin C, vitamin E,  echinacea, or exercise reduces the chance of developing a cold. However, it is always recommended to get plenty of rest, exercise, and practice good nutrition. Contact a health care provider if:  You are getting worse rather than better.  Your symptoms are not controlled by medicine.  You have chills.  You have worsening shortness of breath.  You have brown or red mucus.  You have yellow or brown nasal discharge.  You have pain in your face, especially when you bend forward.  You have a fever.  You have swollen neck glands.  You have pain while swallowing.  You have white areas in the back of your throat. Get help right away if:  You have severe or persistent: ? Headache. ? Ear pain. ? Sinus pain. ? Chest pain.  You have chronic lung disease and any of the following: ? Wheezing. ? Prolonged cough. ? Coughing up blood. ? A change in your usual mucus.  You have a stiff neck.  You have changes in your: ? Vision. ? Hearing. ? Thinking. ? Mood. This information is not intended to replace advice given to you by your health care provider. Make sure you discuss any questions you have with your health care provider. Document Released: 10/31/2000 Document Revised: 01/08/2016 Document Reviewed: 08/12/2013 Elsevier Interactive Patient Education  2017 [employer]    Acute Bronchitis, Adult Acute bronchitis is sudden (acute) swelling of the air tubes (bronchi) in the lungs. Acute bronchitis causes these tubes to fill with mucus, which can make it hard to breathe. It can also cause coughing or wheezing. In adults, acute bronchitis usually goes away within 2 weeks. A cough caused by bronchitis may last up to 3 weeks. Smoking, allergies, and asthma can make the condition worse. Repeated episodes of bronchitis may cause further lung problems, such as chronic obstructive pulmonary disease (COPD). What are the causes? This condition can be caused by germs and by substances that  irritate the lungs, including:  Cold and flu viruses. This condition is most often caused by the same virus that causes a cold.  Bacteria.  Exposure to tobacco smoke, dust, fumes, and air pollution.  What increases the risk? This condition is more likely to develop in people who:  Have close contact with someone with acute bronchitis.  Are exposed to lung irritants, such as tobacco smoke, dust, fumes, and vapors.  Have a weak immune system.  Have a respiratory condition such as asthma.  What are the signs or symptoms? Symptoms of this condition include:  A cough.  Coughing up clear, yellow, or green mucus.  Wheezing.  Chest congestion.  Shortness of breath.  A fever.  Body aches.  Chills.  A sore throat.  How is this diagnosed? This condition is usually diagnosed with a physical exam. During the exam, your health care provider may order tests, such as chest X-rays, to rule out other conditions. He or she may also:  Test a sample of your mucus for bacterial infection.  Check the level of oxygen  in your blood. This is done to check for pneumonia.  Do a chest X-ray or lung function testing to rule out pneumonia and other conditions.  Perform blood tests.  Your health care provider will also ask about your symptoms and medical history. How is this treated? Most cases of acute bronchitis clear up over time without treatment. Your health care provider may recommend:  Drinking more fluids. Drinking more makes your mucus thinner, which may make it easier to breathe.  Taking a medicine for a fever or cough.  Taking an antibiotic medicine.  Using an inhaler to help improve shortness of breath and to control a cough.  Using a cool mist vaporizer or humidifier to make it easier to breathe.  Follow these instructions at home: Medicines  Take over-the-counter and prescription medicines only as told by your health care provider.  If you were prescribed an  antibiotic, take it as told by your health care provider. Do not stop taking the antibiotic even if you start to feel better. General instructions  Get plenty of rest.  Drink enough fluids to keep your urine clear or pale yellow.  Avoid smoking and secondhand smoke. Exposure to cigarette smoke or irritating chemicals will make bronchitis worse. If you smoke and you need help quitting, ask your health care provider. Quitting smoking will help your lungs heal faster.  Use an inhaler, cool mist vaporizer, or humidifier as told by your health care provider.  Keep all follow-up visits as told by your health care provider. This is important. How is this prevented? To lower your risk of getting this condition again:  Wash your hands often with soap and water. If soap and water are not available, use hand sanitizer.  Avoid contact with people who have cold symptoms.  Try not to touch your hands to your mouth, nose, or eyes.  Make sure to get the flu shot every year.  Contact a health care provider if:  Your symptoms do not improve in 2 weeks of treatment. Get help right away if:  You cough up blood.  You have chest pain.  You have severe shortness of breath.  You become dehydrated.  You faint or keep feeling like you are going to faint.  You keep vomiting.  You have a severe headache.  Your fever or chills gets worse. This information is not intended to replace advice given to you by your health care provider. Make sure you discuss any questions you have with your health care provider. Document Released: 06/14/2004 Document Revised: 11/30/2015 Document Reviewed: 10/26/2015 Elsevier Interactive Patient Education  2017 [employer].    IF you received an x-ray today, you will receive an invoice from [facility]. Please contact [facility] at [phone] with questions or concerns regarding your invoice.   IF you received labwork today, you will receive an  invoice from [facility]. Please contact [facility] at [phone] with questions or concerns regarding your invoice.   Our billing staff will not be able to assist you with questions regarding bills from these companies.  You will be contacted with the lab results as soon as they are available. The fastest way to get your results is to activate your My Chart account. Instructions are located on the last page of this paperwork. If you have not heard from [place] regarding the results in 2 weeks, please contact this office.

## 2022-01-15 ENCOUNTER — Other Ambulatory Visit: Payer: Self-pay | Admitting: Physician Assistant

## 2022-01-15 DIAGNOSIS — H532 Diplopia: Secondary | ICD-10-CM

## 2022-01-20 ENCOUNTER — Ambulatory Visit
Admission: RE | Admit: 2022-01-20 | Discharge: 2022-01-20 | Disposition: A | Discharge status: Home / Self Care | Payer: Self-pay | Source: Ambulatory Visit | Attending: Physician Assistant | Admitting: Physician Assistant

## 2022-01-20 DIAGNOSIS — H532 Diplopia: Secondary | ICD-10-CM

## 2022-01-20 MED ORDER — GADOPICLENOL 0.5 MMOL/ML IV SOLN
8.0000 mL | Freq: Once | INTRAVENOUS | Status: AC | PRN
Start: 2022-01-20 — End: 2022-01-20
  Administered 2022-01-20: 8 mL via INTRAVENOUS

## 2022-01-21 ENCOUNTER — Other Ambulatory Visit: Payer: Self-pay

## 2022-01-26 ENCOUNTER — Other Ambulatory Visit: Payer: Self-pay | Admitting: Physician Assistant

## 2022-01-26 DIAGNOSIS — H532 Diplopia: Secondary | ICD-10-CM

## 2022-02-08 ENCOUNTER — Encounter: Payer: Self-pay | Admitting: Neurology

## 2022-02-08 ENCOUNTER — Ambulatory Visit (INDEPENDENT_AMBULATORY_CARE_PROVIDER_SITE_OTHER): Payer: Medicare Other | Admitting: Neurology

## 2022-02-08 VITALS — BP 131/74 | HR 64 | Ht 68.5 in | Wt 194.0 lb

## 2022-02-08 DIAGNOSIS — H532 Diplopia: Secondary | ICD-10-CM

## 2022-02-08 DIAGNOSIS — H4921 Sixth [abducent] nerve palsy, right eye: Secondary | ICD-10-CM | POA: Diagnosis not present

## 2022-02-08 NOTE — Progress Notes (Signed)
[facility]  PATIENT: Colton Suarez DOB: September 04, 1955  REFERRING DOCTOR OR PCP: Cephas Darby, PA; Merri Ray, MD SOURCE: Patient, notes from primary care  _________________________________   HISTORICAL  CHIEF COMPLAINT:  Chief Complaint  Patient presents with   New Patient (Initial Visit)    RM 1, with wife. Paper referral for double vision. Wears glasses.     HISTORY OF PRESENT ILLNESS:  I had the pleasure to see your patient, Colton Suarez, at [facility] for neurologic consultation regarding his diplopia.  He is a 66 year old man who had the sudden onset of diplopia in mid-August 2023.   His wife was able to see a dysconjugate gaze.    He has no diplopia on left gaze but notes on primary gaze or right gaze.   Diplopia is monocular.   He denied pain at the onset of symptoms.   He saw ophthalmology.  He was advised to see primary care and have an MRI performed.  He saw her primary care.  CBC and CMP were normal.  An MRI was ordered.  I personally reviewed the MRI and it shows mild chronic microvascular ischemic changes, typical for age.  The orbits appear normal.  Flow voids are present in the carotid arteries.  He denies headaches.   He denies jaw claudication.   He notes some right neck and shoulder pain off/on x several yearsand he feels it is mildly worse.  He does have some back pain and muscle spasms at times.   He notes no change in gait, balance strength or sensation.     Vascular:  Does not have DM, HTN.  No smoking history.  Lipids were borderline in past.   REVIEW OF SYSTEMS: Constitutional: No fevers, chills, sweats, or change in appetite Eyes: No visual changes, double vision, eye pain Ear, nose and throat: No hearing loss, ear pain, nasal congestion, sore throat Cardiovascular: No chest pain, palpitations Respiratory:  No shortness of breath at rest or with exertion.   No wheezes GastrointestinaI: No nausea, vomiting,  diarrhea, abdominal pain, fecal incontinence Genitourinary:  No dysuria, urinary retention or frequency.  No nocturia. Musculoskeletal: Has back pain and spasms.   Integumentary: No rash, pruritus, skin lesions Neurological: as above Psychiatric: No depression at this time.  No anxiety Endocrine: No palpitations, diaphoresis, change in appetite, change in weigh or increased thirst Hematologic/Lymphatic:  No anemia, purpura, petechiae. Allergic/Immunologic: No itchy/runny eyes, nasal congestion, recent allergic reactions, rashes  ALLERGIES: Allergies  Allergen Reactions   Other     STEROIDS: Does not know name, however the reaction lead to pancreatitis.     HOME MEDICATIONS:  Current Outpatient Medications:    cetirizine (ZYRTEC) 10 MG tablet, Take 10 mg by mouth daily., Disp: , Rfl:    fluticasone (FLONASE) 50 MCG/ACT nasal spray, Place 2 sprays into both nostrils daily., Disp: 16 g, Rfl: 12  PAST MEDICAL HISTORY: Past Medical History:  Diagnosis Date   Allergy     PAST SURGICAL HISTORY: Past Surgical History:  Procedure Laterality Date   THROAT SURGERY     tosilectomy   TONSILLECTOMY AND ADENOIDECTOMY     at 66yo    FAMILY HISTORY: Family History  Problem Relation Age of Onset   Hypertension Father     SOCIAL HISTORY: Social History   Socioeconomic History   Marital status: Married    Spouse name: Colton Suarez   Number of children: 2   Years of education: 12   Highest  education level: Not on file  Occupational History   Not on file  Tobacco Use   Smoking status: Never   Smokeless tobacco: Never  Vaping Use   Vaping Use: Never used  Substance and Sexual Activity   Alcohol use: Yes    Comment: social-beer (2-3 times per week)   Drug use: No   Sexual activity: Not on file  Other Topics Concern   Not on file  Social History Narrative   Right handed    Coffee/tea (some)/soda-80oz per day   Lives w/ spouse.   Social Determinants of Health   Financial  Resource Strain: Not on file  Food Insecurity: Not on file  Transportation Needs: Not on file  Physical Activity: Not on file  Stress: Not on file  Social Connections: Not on file  Intimate Partner Violence: Not on file       PHYSICAL EXAM  Vitals:   02/08/22 1013  BP: 131/74  Pulse: 64  Weight: 194 lb (88 kg)  Height: 5' 8.5" (1.74 m)    Body mass index is 29.07 kg/m.   General: The patient is well-developed and well-nourished and in no acute distress  HEENT:  Head is [place]/AT.  Sclera are anicteric.  Funduscopic exam shows normal optic discs and retinal vessels.  Neck: No carotid bruits are noted.  The neck is nontender.  Cardiovascular: The heart has a regular rate and rhythm with a normal S1 and S2. There were no murmurs, gallops or rubs.    Skin: Extremities are without rash or  edema.  Musculoskeletal:  Back is nontender  Neurologic Exam  Mental status: The patient is alert and oriented x 3 at the time of the examination. The patient has apparent normal recent and remote memory, with an apparently normal attention span and concentration ability.   Speech is normal.  Cranial nerves: He has diplopia on primary gaze and looking to the right.  Head tilt does not change diplopia.  I detect minimal reduced abduction of the right eye.   Pupils are equal, round, and reactive to light and accomodation.   Facial symmetry is present. There is good facial sensation to soft touch bilaterally.Facial strength is normal.  Trapezius and sternocleidomastoid strength is normal. No dysarthria is noted.  The tongue is midline, and the patient has symmetric elevation of the soft palate. No obvious hearing deficits are noted.  Motor:  Muscle bulk is normal.   Tone is normal. Strength is  5 / 5 in all 4 extremities.   Sensory: Sensory testing is intact to pinprick, soft touch and vibration sensation in all 4 extremities.  Coordination: Cerebellar testing reveals good finger-nose-finger and  heel-to-shin bilaterally.  Gait and station: Station is normal.   Gait is normal. Tandem gait is normal. Romberg is negative.   Reflexes: Deep tendon reflexes are symmetric and normal bilaterally.       DIAGNOSTIC DATA (LABS, IMAGING, TESTING) - I reviewed patient records, labs, notes, testing and imaging myself where available.  Lab Results  Component Value Date   WBC 5.9 02/23/2014   HGB 15.3 02/23/2014   HCT 47.2 02/23/2014   MCV 92.7 02/23/2014      Component Value Date/Time   NA 137 02/23/2014 1330   K 4.9 02/23/2014 1330   CL 102 02/23/2014 1330   CO2 27 02/23/2014 1330   GLUCOSE 90 02/23/2014 1330   BUN 9 02/23/2014 1330   CREATININE 0.96 02/23/2014 1330   CALCIUM 9.6 02/23/2014 1330   PROT  7.4 02/23/2014 1330   ALBUMIN 4.7 02/23/2014 1330   AST 21 02/23/2014 1330   ALT 29 02/23/2014 1330   ALKPHOS 66 02/23/2014 1330   BILITOT 0.9 02/23/2014 1330   GFRNONAA 87 02/23/2014 1330   GFRAA >89 02/23/2014 1330   Lab Results  Component Value Date   CHOL 207 (H) 02/23/2014   HDL 54 02/23/2014   LDLCALC 132 (H) 02/23/2014   TRIG 107 02/23/2014   CHOLHDL 3.8 02/23/2014   No results found for: "HGBA1C" No results found for: "VITAMINB12" Lab Results  Component Value Date   TSH 2.468 02/23/2014       ASSESSMENT AND PLAN  6th nerve palsy, right - Plan: Sedimentation rate, C-reactive protein  Diplopia - Plan: Sedimentation rate, C-reactive protein   In summary, Mr. Bazen is a 66 year old man with a sudden onset of binocular diplopia 1 month ago.  Symptoms are beginning to improve.  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  I discussed with him that vascular risk factors can increase the chance of a VI nerve palsy.  However, he does not really have any risk factors.  I will need to check an ESR, CRP to rule out temporal arteritis.  If abnormal we would need to consider temporal artery biopsy and treatment.    He is already improving and a full  recovery is expected that this may take a couple more months.  He will return to see me as needed if he has new symptoms.    Thank you for asking me to see Mr. Mannor.  Please let me know if I can be of further assistance with him or other patients in the future.   Karlen Barbar A. Felecia Shelling, MD, [facility] 1/61/0960, 45:40 AM Certified in Neurology, Clinical Neurophysiology, Sleep Medicine and Neuroimaging  [facility] [address] [place], [place] [address] [phone]

## 2022-02-09 LAB — C-REACTIVE PROTEIN: CRP: 2 mg/L (ref 0–10)

## 2022-02-09 LAB — SEDIMENTATION RATE: Sed Rate: 2 mm/hr (ref 0–30)

## 2022-11-30 NOTE — H&P (Signed)
PREOPERATIVE H&P  Chief Complaint: RIGHT KNEE MEDIAL AND LATERAL MENISCUS TEARS  HPI: Colton Suarez is a 67 y.o. male who presents with a diagnosis of RIGHT KNEE MEDIAL AND LATERAL MENISCUS TEARS. Symptoms are rated as moderate to severe, and have been worsening.  This is significantly impairing activities of daily living.  He has elected for surgical management.   Past Medical History:  Diagnosis Date   Allergy    Past Surgical History:  Procedure Laterality Date   THROAT SURGERY     tosilectomy   TONSILLECTOMY AND ADENOIDECTOMY     at 67yo   Social History   Socioeconomic History   Marital status: Married    Spouse name: Gavin Pound   Number of children: 2   Years of education: 12   Highest education level: Not on file  Occupational History   Not on file  Tobacco Use   Smoking status: Never   Smokeless tobacco: Never  Vaping Use   Vaping status: Never Used  Substance and Sexual Activity   Alcohol use: Yes    Comment: social-beer (2-3 times per week)   Drug use: No   Sexual activity: Not on file  Other Topics Concern   Not on file  Social History Narrative   Right handed    Coffee/tea (some)/soda-80oz per day   Lives w/ spouse.   Social Determinants of Health   Financial Resource Strain: Not on file  Food Insecurity: Not on file  Transportation Needs: Not on file  Physical Activity: Not on file  Stress: Not on file  Social Connections: Not on file   Family History  Problem Relation Age of Onset   Hypertension Father    Allergies  Allergen Reactions   Other     STEROIDS: Does not know name, however the reaction lead to pancreatitis.    Prior to Admission medications   Medication Sig Start Date End Date Taking? Authorizing Provider  cetirizine (ZYRTEC) 10 MG tablet Take 10 mg by mouth daily.    [provider]  fluticasone (FLONASE) 50 MCG/ACT nasal spray Place 2 sprays into both nostrils daily. 02/23/14   Ofilia Neas, PA-C     Positive  ROS: All other systems have been reviewed and were otherwise negative with the exception of those mentioned in the HPI and as above.  Physical Exam: General: Alert, no acute distress Cardiovascular: No pedal edema Respiratory: No cyanosis, no use of accessory musculature GI: No organomegaly, abdomen is soft and non-tender Skin: No lesions in the area of chief complaint Neurologic: Sensation intact distally Psychiatric: Patient is competent for consent with normal mood and affect Lymphatic: No axillary or cervical lymphadenopathy  MUSCULOSKELETAL: TTP medial and lateral joint lines, painful limited knee ROM, decreased strength, + McMurray's test, NVI   Imaging: MRI shows extensive oblique coursing inferior articular surface flap type tear involving the posterior horn and mid body region of the medial meniscus with a intrameniscal cyst. Complex inferior articular surface tear involving the anterior horn and midbody junction of the lateral meniscus   Assessment: RIGHT KNEE MEDIAL AND LATERAL MENISCUS TEARS  Plan: Plan for Procedure(s): KNEE ARTHROSCOPY WITH CHONDROPLASTY, MEDIAL AND LATERAL MENISECTOMIES  The risks benefits and alternatives were discussed with the patient including but not limited to the risks of nonoperative treatment, versus surgical intervention including infection, bleeding, nerve injury,  blood clots, cardiopulmonary complications, morbidity, mortality, among others, and they were willing to proceed.   Weightbearing: WBAT RLE Orthopedic devices: none Showering: POD 3  Dressing: reinforce PRN Medicines: ASA, Norco, Mobic, Zofran  Discharge: home Follow up: 12/21/22 at 9:45am    Jenne Pane, PA-C Office [phone] 11/30/2022 3:04 PM

## 2022-12-03 ENCOUNTER — Other Ambulatory Visit: Payer: Self-pay

## 2022-12-03 ENCOUNTER — Encounter (HOSPITAL_BASED_OUTPATIENT_CLINIC_OR_DEPARTMENT_OTHER): Payer: Self-pay | Admitting: Orthopedic Surgery

## 2022-12-05 NOTE — Progress Notes (Signed)

## 2022-12-10 NOTE — Anesthesia Preprocedure Evaluation (Addendum)
Anesthesia Evaluation  Patient identified by MRN, date of birth, ID band Patient awake    Reviewed: Allergy & Precautions, NPO status , Patient's Chart, lab work & pertinent test results  History of Anesthesia Complications Negative for: history of anesthetic complications  Airway Mallampati: II  TM Distance: >3 FB Neck ROM: Full    Dental no notable dental hx.    Pulmonary neg pulmonary ROS   Pulmonary exam normal        Cardiovascular negative cardio ROS Normal cardiovascular exam     Neuro/Psych    GI/Hepatic negative GI ROS, Neg liver ROS,,,  Endo/Other  negative endocrine ROS    Renal/GU negative Renal ROS     Musculoskeletal RIGHT KNEE MEDIAL AND LATERAL MENISCUS TEARS   Abdominal   Peds  Hematology negative hematology ROS (+)   Anesthesia Other Findings Day of surgery medications reviewed with patient.  Reproductive/Obstetrics                              Anesthesia Physical Anesthesia Plan  ASA: 2  Anesthesia Plan: General   Post-op Pain Management: Tylenol PO (pre-op)*   Induction: Intravenous  PONV Risk Score and Plan: 2 and Ondansetron, Dexamethasone, Treatment may vary due to age or medical condition and Midazolam  Airway Management Planned: LMA  Additional Equipment: None  Intra-op Plan:   Post-operative Plan: Extubation in OR  Informed Consent: I have reviewed the patients History and Physical, chart, labs and discussed the procedure including the risks, benefits and alternatives for the proposed anesthesia with the patient or authorized representative who has indicated his/her understanding and acceptance.     Dental advisory given  Plan Discussed with: CRNA  Anesthesia Plan Comments:          Anesthesia Quick Evaluation

## 2022-12-11 ENCOUNTER — Ambulatory Visit (HOSPITAL_BASED_OUTPATIENT_CLINIC_OR_DEPARTMENT_OTHER): Payer: Medicare Other | Admitting: Anesthesiology

## 2022-12-11 ENCOUNTER — Encounter (HOSPITAL_BASED_OUTPATIENT_CLINIC_OR_DEPARTMENT_OTHER)
Admission: RE | Disposition: A | Discharge status: Home / Self Care | Payer: Self-pay | Source: Home / Self Care | Attending: Orthopedic Surgery

## 2022-12-11 ENCOUNTER — Ambulatory Visit (HOSPITAL_BASED_OUTPATIENT_CLINIC_OR_DEPARTMENT_OTHER)
Admission: RE | Admit: 2022-12-11 | Discharge: 2022-12-11 | Disposition: A | Discharge status: Home / Self Care | Payer: Medicare Other | Source: Home / Self Care | Attending: Orthopedic Surgery | Admitting: Orthopedic Surgery

## 2022-12-11 ENCOUNTER — Encounter (HOSPITAL_BASED_OUTPATIENT_CLINIC_OR_DEPARTMENT_OTHER): Payer: Self-pay | Admitting: Orthopedic Surgery

## 2022-12-11 DIAGNOSIS — S83271A Complex tear of lateral meniscus, current injury, right knee, initial encounter: Secondary | ICD-10-CM | POA: Insufficient documentation

## 2022-12-11 DIAGNOSIS — S83241A Other tear of medial meniscus, current injury, right knee, initial encounter: Secondary | ICD-10-CM | POA: Insufficient documentation

## 2022-12-11 DIAGNOSIS — Z01818 Encounter for other preprocedural examination: Secondary | ICD-10-CM

## 2022-12-11 DIAGNOSIS — X58XXXA Exposure to other specified factors, initial encounter: Secondary | ICD-10-CM | POA: Insufficient documentation

## 2022-12-11 DIAGNOSIS — S83203A Other tear of unspecified meniscus, current injury, right knee, initial encounter: Secondary | ICD-10-CM

## 2022-12-11 HISTORY — PX: KNEE ARTHROSCOPY WITH MEDIAL MENISECTOMY: SHX5651

## 2022-12-11 SURGERY — ARTHROSCOPY, KNEE, WITH MEDIAL MENISCECTOMY
Anesthesia: General | Site: Knee | Laterality: Right

## 2022-12-11 MED ORDER — METHYLPREDNISOLONE ACETATE 80 MG/ML IJ SUSP
INTRAMUSCULAR | Status: DC | PRN
  Administered 2022-12-11: 1 mL via INTRA_ARTICULAR

## 2022-12-11 MED ORDER — PROPOFOL 10 MG/ML IV BOLUS
INTRAVENOUS | Status: DC | PRN
Start: 2022-12-11 — End: 2022-12-11
  Administered 2022-12-11: 200 mg via INTRAVENOUS

## 2022-12-11 MED ORDER — LIDOCAINE 2% (20 MG/ML) 5 ML SYRINGE
INTRAMUSCULAR | Status: AC
  Filled 2022-12-11: qty 5

## 2022-12-11 MED ORDER — MIDAZOLAM HCL 2 MG/2ML IJ SOLN
INTRAMUSCULAR | Status: AC
  Filled 2022-12-11: qty 2

## 2022-12-11 MED ORDER — ONDANSETRON 4 MG PO TBDP: 4.0000 mg | ORAL_TABLET | Freq: Three times a day (TID) | ORAL | 0 refills | Status: AC | PRN

## 2022-12-11 MED ORDER — DEXAMETHASONE SODIUM PHOSPHATE 10 MG/ML IJ SOLN
INTRAMUSCULAR | Status: AC
  Filled 2022-12-11: qty 1

## 2022-12-11 MED ORDER — CEFAZOLIN SODIUM-DEXTROSE 2-4 GM/100ML-% IV SOLN
2.0000 g | INTRAVENOUS | Status: AC
  Administered 2022-12-11: 2 g via INTRAVENOUS

## 2022-12-11 MED ORDER — FENTANYL CITRATE (PF) 100 MCG/2ML IJ SOLN: 25.0000 ug | INTRAMUSCULAR | Status: DC | PRN

## 2022-12-11 MED ORDER — ONDANSETRON HCL 4 MG/2ML IJ SOLN
INTRAMUSCULAR | Status: AC
  Filled 2022-12-11: qty 2

## 2022-12-11 MED ORDER — MELOXICAM 15 MG PO TABS: 15.0000 mg | ORAL_TABLET | Freq: Every day | ORAL | 0 refills | Status: AC | PRN

## 2022-12-11 MED ORDER — DEXAMETHASONE SODIUM PHOSPHATE 10 MG/ML IJ SOLN
INTRAMUSCULAR | Status: DC | PRN
  Administered 2022-12-11: 4 mg via INTRAVENOUS

## 2022-12-11 MED ORDER — OXYCODONE HCL 5 MG PO TABS: 5.0000 mg | ORAL_TABLET | Freq: Once | ORAL | Status: DC | PRN

## 2022-12-11 MED ORDER — HYDROCODONE-ACETAMINOPHEN 10-325 MG PO TABS: 1.0000 | ORAL_TABLET | Freq: Four times a day (QID) | ORAL | 0 refills | Status: AC | PRN

## 2022-12-11 MED ORDER — OXYCODONE HCL 5 MG/5ML PO SOLN: 5.0000 mg | Freq: Once | ORAL | Status: DC | PRN

## 2022-12-11 MED ORDER — BUPIVACAINE HCL (PF) 0.25 % IJ SOLN
INTRAMUSCULAR | Status: DC | PRN
  Administered 2022-12-11: 4 mL via INTRA_ARTICULAR

## 2022-12-11 MED ORDER — CEFAZOLIN SODIUM-DEXTROSE 2-4 GM/100ML-% IV SOLN
INTRAVENOUS | Status: AC
  Filled 2022-12-11: qty 100

## 2022-12-11 MED ORDER — ACETAMINOPHEN 500 MG PO TABS
1000.0000 mg | ORAL_TABLET | Freq: Once | ORAL | Status: AC
  Administered 2022-12-11: 1000 mg via ORAL

## 2022-12-11 MED ORDER — ACETAMINOPHEN 500 MG PO TABS
ORAL_TABLET | ORAL | Status: AC
  Filled 2022-12-11: qty 2

## 2022-12-11 MED ORDER — AMISULPRIDE (ANTIEMETIC) 5 MG/2ML IV SOLN: 10.0000 mg | Freq: Once | INTRAVENOUS | Status: DC | PRN

## 2022-12-11 MED ORDER — FENTANYL CITRATE (PF) 100 MCG/2ML IJ SOLN
INTRAMUSCULAR | Status: AC
  Filled 2022-12-11: qty 2

## 2022-12-11 MED ORDER — FENTANYL CITRATE (PF) 100 MCG/2ML IJ SOLN
INTRAMUSCULAR | Status: DC | PRN
  Administered 2022-12-11: 100 ug via INTRAVENOUS

## 2022-12-11 MED ORDER — LACTATED RINGERS IV SOLN: INTRAVENOUS | Status: DC

## 2022-12-11 MED ORDER — METHYLPREDNISOLONE ACETATE 80 MG/ML IJ SUSP
INTRAMUSCULAR | Status: AC
  Filled 2022-12-11: qty 1

## 2022-12-11 MED ORDER — PROPOFOL 10 MG/ML IV BOLUS
INTRAVENOUS | Status: AC
  Filled 2022-12-11: qty 20

## 2022-12-11 MED ORDER — SODIUM CHLORIDE 0.9 % IR SOLN
Status: DC | PRN
  Administered 2022-12-11: 3000 mL

## 2022-12-11 MED ORDER — ASPIRIN 81 MG PO TBEC: 81.0000 mg | DELAYED_RELEASE_TABLET | Freq: Two times a day (BID) | ORAL | 0 refills | Status: AC

## 2022-12-11 MED ORDER — ONDANSETRON HCL 4 MG/2ML IJ SOLN
INTRAMUSCULAR | Status: DC | PRN
  Administered 2022-12-11: 4 mg via INTRAVENOUS

## 2022-12-11 MED ORDER — POVIDONE-IODINE 10 % EX SWAB
2.0000 | Freq: Once | CUTANEOUS | Status: AC
  Administered 2022-12-11: 2 via TOPICAL

## 2022-12-11 MED ORDER — LIDOCAINE HCL (CARDIAC) PF 100 MG/5ML IV SOSY
PREFILLED_SYRINGE | INTRAVENOUS | Status: DC | PRN
  Administered 2022-12-11: 100 mg via INTRAVENOUS

## 2022-12-11 SURGICAL SUPPLY — 36 items
APL PRP STRL LF DISP 70% ISPRP (MISCELLANEOUS) ×1
BNDG CMPR 6 X 5 YARDS HK CLSR (GAUZE/BANDAGES/DRESSINGS) ×1
BNDG ELASTIC 6INX 5YD STR LF (GAUZE/BANDAGES/DRESSINGS) ×1 IMPLANT
CHLORAPREP W/TINT 26 (MISCELLANEOUS) ×1 IMPLANT
CUFF TOURN SGL QUICK 34 (TOURNIQUET CUFF) ×1
CUFF TRNQT CYL 34X4.125X (TOURNIQUET CUFF) ×1 IMPLANT
DISSECTOR 3.8MM X 13CM (MISCELLANEOUS) ×1 IMPLANT
DISSECTOR 4.0MM X 13CM (MISCELLANEOUS) IMPLANT
DRAPE ARTHROSCOPY W/POUCH 90 (DRAPES) ×1 IMPLANT
DRAPE IMP U-DRAPE 54X76 (DRAPES) ×1 IMPLANT
DRAPE U-SHAPE 47X51 STRL (DRAPES) ×1 IMPLANT
DRSG EMULSION OIL 3X3 NADH (GAUZE/BANDAGES/DRESSINGS) ×1 IMPLANT
EXCALIBUR 3.8MM X 13CM (MISCELLANEOUS) IMPLANT
GAUZE SPONGE 4X4 12PLY STRL (GAUZE/BANDAGES/DRESSINGS) ×1 IMPLANT
GLOVE BIO SURGEON STRL SZ7.5 (GLOVE) ×1 IMPLANT
GLOVE BIOGEL PI IND STRL 7.5 (GLOVE) ×2 IMPLANT
GLOVE BIOGEL PI IND STRL 8 (GLOVE) ×1 IMPLANT
GLOVE BIOGEL PI IND STRL 8.5 (GLOVE) IMPLANT
GLOVE SURG SYN 7.5 E (GLOVE) ×1
GLOVE SURG SYN 7.5 PF PI (GLOVE) ×1 IMPLANT
GOWN STRL REUS W/ TWL LRG LVL3 (GOWN DISPOSABLE) ×3 IMPLANT
GOWN STRL REUS W/ TWL XL LVL3 (GOWN DISPOSABLE) ×1 IMPLANT
GOWN STRL REUS W/TWL LRG LVL3 (GOWN DISPOSABLE)
GOWN STRL REUS W/TWL XL LVL3 (GOWN DISPOSABLE) ×3
MANIFOLD NEPTUNE II (INSTRUMENTS) ×1 IMPLANT
PACK ARTHROSCOPY DSU (CUSTOM PROCEDURE TRAY) ×1 IMPLANT
PACK BASIN DAY SURGERY FS (CUSTOM PROCEDURE TRAY) ×1 IMPLANT
PACK ICE MAXI GEL EZY WRAP (MISCELLANEOUS) ×1 IMPLANT
PORT APPOLLO RF 90DEGREE MULTI (SURGICAL WAND) IMPLANT
SLEEVE SCD COMPRESS KNEE MED (STOCKING) ×1 IMPLANT
SUT ETHILON 3 0 PS 1 (SUTURE) ×1 IMPLANT
TAPE CLOTH 3X10 TAN LF (GAUZE/BANDAGES/DRESSINGS) IMPLANT
TOWEL GREEN STERILE FF (TOWEL DISPOSABLE) ×1 IMPLANT
TUBING ARTHROSCOPY IRRIG 16FT (MISCELLANEOUS) ×1 IMPLANT
WATER STERILE IRR 1000ML POUR (IV SOLUTION) ×1 IMPLANT
WRAP KNEE MAXI GEL POST OP (GAUZE/BANDAGES/DRESSINGS) ×1 IMPLANT

## 2022-12-11 NOTE — Op Note (Signed)
12/11/2022  10:00 AM  PATIENT:  Colton Suarez    PRE-OPERATIVE DIAGNOSIS:  RIGHT KNEE MEDIAL AND LATERAL MENISCUS TEARS  POST-OPERATIVE DIAGNOSIS:  Same  PROCEDURE:  KNEE ARTHROSCOPY WITH CHONDROPLASTY, MEDIAL AND LATERAL MENISECTOMIES  SURGEON:  Sheral Apley, MD  ASSISTANT: Levester Fresh, PA-C, he was present and scrubbed throughout the case, critical for completion in a timely fashion, and for retraction, instrumentation, and closure.   ANESTHESIA:   General  BLOOD LOSS: min  COMPLICATIONS: None   PREOPERATIVE INDICATIONS:  Colton Suarez is a  67 y.o. male with a diagnosis of RIGHT KNEE MEDIAL AND LATERAL MENISCUS TEARS who failed conservative measures and elected for surgical management.    The risks benefits and alternatives were discussed with the patient preoperatively including but not limited to the risks of infection, bleeding, nerve injury, cardiopulmonary complications, the need for revision surgery, among others, and the patient was willing to proceed.  OPERATIVE IMPLANTS: none  OPERATIVE FINDINGS: Examination under anesthesia: stable Diagnostic Arthroscopy:  articular cartilage:grade2 Medial meniscus:radial tear Lateral meniscus:complex tear Anterior cruciate ligament/PCL: stable Loose bodies: none    OPERATIVE PROCEDURE:  Patient was identified in the preoperative holding area and site was marked by me male was transported to the operating theater and placed on the table in supine position taking care to pad all bony prominences. After a preincinduction time out anesthesia was induced.  male received ancef for preoperative antibiotics. The right lower extremity was prepped and draped in normal sterile fashion and a pre-incision timeout was performed.   A small stab incision was made in the anterolateral portal position. The arthroscope was introduced in the joint. A medial portal was then established under direct visualization just above the anterior horn  of the medial meniscus. Diagnostic arthroscopy was then carried out with findings as described above.  I debrided the medial and lateral meniscus tears with a shave.   I performed a chondroplasty with a shaver of the PF space.   The arthroscopic equipment was removed from the joint and the portals were closed with 3-0 nylon in an interrupted fashion. The knee was infiltrated with depomedrol.  Sterile dressings were then applied including Xeroform 4 x 4's ABDs an ACE bandage.  The patient was then allowed to awaken from general anesthesia, transferred to the stretcher and taken to the recovery room in stable condition.  POSTOPERATIVE PLAN: The patient will be discharged home today and will followup in one week for suture removal and wound check.

## 2022-12-11 NOTE — Anesthesia Procedure Notes (Signed)
Procedure Name: LMA Insertion Date/Time: 12/11/2022 9:38 AM  Performed by: Burna Cash, CRNAPre-anesthesia Checklist: Patient identified, Emergency Drugs available, Suction available and Patient being monitored Patient Re-evaluated:Patient Re-evaluated prior to induction Oxygen Delivery Method: Circle system utilized Preoxygenation: Pre-oxygenation with 100% oxygen Induction Type: IV induction Ventilation: Mask ventilation without difficulty LMA: LMA inserted LMA Size: 5.0 Number of attempts: 1 Airway Equipment and Method: Bite block Placement Confirmation: positive ETCO2 Tube secured with: Tape Dental Injury: Teeth and Oropharynx as per pre-operative assessment

## 2022-12-11 NOTE — Interval H&P Note (Signed)
History and Physical Interval Note:  12/11/2022 7:20 AM  Colton Suarez  has presented today for surgery, with the diagnosis of RIGHT KNEE MEDIAL AND LATERAL MENISCUS TEARS.  The various methods of treatment have been discussed with the patient and family. After consideration of risks, benefits and other options for treatment, the patient has consented to  Procedure(s): KNEE ARTHROSCOPY WITH CHONDROPLASTY, MEDIAL AND LATERAL MENISECTOMIES (Right) as a surgical intervention.  The patient's history has been reviewed, patient examined, no change in status, stable for surgery.  I have reviewed the patient's chart and labs.  Questions were answered to the patient's satisfaction.     Sheral Apley

## 2022-12-11 NOTE — Discharge Instructions (Addendum)
POST-OPERATIVE OPIOID TAPER INSTRUCTIONS: It is important to wean off of your opioid medication as soon as possible. If you do not need pain medication after your surgery it is ok to stop day one. Opioids include: Codeine, Hydrocodone(Norco, Vicodin), Oxycodone(Percocet, oxycontin) and hydromorphone amongst others.  Long term and even short term use of opiods can cause: Increased pain response Dependence Constipation Depression Respiratory depression And more.  Withdrawal symptoms can include Flu like symptoms Nausea, vomiting And more Techniques to manage these symptoms Hydrate well Eat regular healthy meals Stay active Use relaxation techniques(deep breathing, meditating, yoga) Do Not substitute Alcohol to help with tapering If you have been on opioids for less than two weeks and do not have pain than it is ok to stop all together.  Plan to wean off of opioids This plan should start within one week post op of your joint replacement. Maintain the same interval or time between taking each dose and first decrease the dose.  Cut the total daily intake of opioids by one tablet each day Next start to increase the time between doses. The last dose that should be eliminated is the evening dose.    No Tylenol until after 1:40pm today, if needed.  Post Anesthesia Home Care Instructions  Activity: Get plenty of rest for the remainder of the day. A responsible individual must stay with you for 24 hours following the procedure.  For the next 24 hours, DO NOT: -Drive a car -Advertising copywriter -Drink alcoholic beverages -Take any medication unless instructed by your physician -Make any legal decisions or sign important papers.  Meals: Start with liquid foods such as gelatin or soup. Progress to regular foods as tolerated. Avoid greasy, spicy, heavy foods. If nausea and/or vomiting occur, drink only clear liquids until the nausea and/or vomiting subsides. Call your physician if vomiting  continues.  Special Instructions/Symptoms: Your throat may feel dry or sore from the anesthesia or the breathing tube placed in your throat during surgery. If this causes discomfort, gargle with warm salt water. The discomfort should disappear within 24 hours.  If you had a scopolamine patch placed behind your ear for the management of post- operative nausea and/or vomiting:  1. The medication in the patch is effective for 72 hours, after which it should be removed.  Wrap patch in a tissue and discard in the trash. Wash hands thoroughly with soap and water. 2. You may remove the patch earlier than 72 hours if you experience unpleasant side effects which may include dry mouth, dizziness or visual disturbances. 3. Avoid touching the patch. Wash your hands with soap and water after contact with the patch.

## 2022-12-11 NOTE — Transfer of Care (Signed)
Immediate Anesthesia Transfer of Care Note  Patient: Colton Suarez  Procedure(s) Performed: KNEE ARTHROSCOPY WITH CHONDROPLASTY, MEDIAL AND LATERAL MENISECTOMIES (Right: Knee)  Patient Location: PACU  Anesthesia Type:General  Level of Consciousness: sedated  Airway & Oxygen Therapy: Patient Spontanous Breathing and Patient connected to face mask oxygen  Post-op Assessment: Report given to RN and Post -op Vital signs reviewed and stable  Post vital signs: Reviewed and stable  Last Vitals:  Vitals Value Taken Time  BP 117/73 12/11/22 1009  Temp 36.3 C 12/11/22 1009  Pulse 85 12/11/22 1012  Resp 17 12/11/22 1012  SpO2 99 % 12/11/22 1012  Vitals shown include unfiled device data.  Last Pain:  Vitals:   12/11/22 0732  TempSrc: Oral  PainSc: 3          Complications: No notable events documented.

## 2022-12-11 NOTE — Anesthesia Postprocedure Evaluation (Signed)
Anesthesia Post Note  Patient: Colton Suarez  Procedure(s) Performed: KNEE ARTHROSCOPY WITH CHONDROPLASTY, MEDIAL AND LATERAL MENISECTOMIES (Right: Knee)     Patient location during evaluation: PACU Anesthesia Type: General Level of consciousness: awake and alert Pain management: pain level controlled Vital Signs Assessment: post-procedure vital signs reviewed and stable Respiratory status: spontaneous breathing, nonlabored ventilation and respiratory function stable Cardiovascular status: blood pressure returned to baseline Postop Assessment: no apparent nausea or vomiting Anesthetic complications: no   No notable events documented.  Last Vitals:  Vitals:   12/11/22 1015 12/11/22 1030  BP: 129/68 117/70  Pulse: 78 78  Resp: 14 17  Temp:    SpO2: 100% 96%    Last Pain:  Vitals:   12/11/22 1030  TempSrc:   PainSc: 0-No pain                 Shanda Howells

## 2022-12-12 ENCOUNTER — Encounter (HOSPITAL_BASED_OUTPATIENT_CLINIC_OR_DEPARTMENT_OTHER): Payer: Self-pay | Admitting: Orthopedic Surgery
